# Patient Record
Sex: Male | Born: 1960 | Race: Black or African American | Hispanic: No | Marital: Single | Smoking: Never smoker
Health system: Southern US, Community
[De-identification: ages and names within clinical notes are randomized; demographics above are authoritative.]

## PROBLEM LIST (undated history)

## (undated) DIAGNOSIS — E119 Type 2 diabetes mellitus without complications: Secondary | ICD-10-CM

## (undated) DIAGNOSIS — N289 Disorder of kidney and ureter, unspecified: Secondary | ICD-10-CM

---

## 2007-12-05 ENCOUNTER — Emergency Department: Payer: Self-pay | Admitting: Emergency Medicine

## 2010-12-28 ENCOUNTER — Emergency Department: Payer: Self-pay | Admitting: Emergency Medicine

## 2010-12-30 ENCOUNTER — Emergency Department: Payer: Self-pay | Admitting: Unknown Physician Specialty

## 2011-04-15 ENCOUNTER — Emergency Department: Payer: Self-pay

## 2011-04-15 LAB — URINALYSIS, COMPLETE
Bacteria: NONE SEEN
Bilirubin,UR: NEGATIVE
Blood: NEGATIVE
Glucose,UR: NEGATIVE mg/dL (ref 0–75)
Ketone: NEGATIVE
Leukocyte Esterase: NEGATIVE
Nitrite: NEGATIVE
Ph: 5 (ref 4.5–8.0)
Protein: NEGATIVE
RBC,UR: 1 /HPF (ref 0–5)
Specific Gravity: 1.018 (ref 1.003–1.030)
Squamous Epithelial: NONE SEEN
WBC UR: 1 /HPF (ref 0–5)

## 2011-04-20 ENCOUNTER — Ambulatory Visit: Payer: Self-pay | Admitting: Internal Medicine

## 2012-04-20 ENCOUNTER — Ambulatory Visit: Payer: Self-pay

## 2012-09-21 DIAGNOSIS — M47816 Spondylosis without myelopathy or radiculopathy, lumbar region: Secondary | ICD-10-CM | POA: Insufficient documentation

## 2012-09-21 DIAGNOSIS — Y990 Civilian activity done for income or pay: Secondary | ICD-10-CM | POA: Insufficient documentation

## 2012-09-21 DIAGNOSIS — M4802 Spinal stenosis, cervical region: Secondary | ICD-10-CM | POA: Insufficient documentation

## 2012-10-25 ENCOUNTER — Emergency Department: Payer: Self-pay | Admitting: Emergency Medicine

## 2013-02-07 ENCOUNTER — Emergency Department: Payer: Self-pay | Admitting: Emergency Medicine

## 2013-02-07 LAB — COMPREHENSIVE METABOLIC PANEL
Albumin: 3.7 g/dL (ref 3.4–5.0)
Anion Gap: 2 — ABNORMAL LOW (ref 7–16)
Bilirubin,Total: 0.4 mg/dL (ref 0.2–1.0)
EGFR (Non-African Amer.): 52 — ABNORMAL LOW
Osmolality: 279 (ref 275–301)
SGPT (ALT): 32 U/L (ref 12–78)
Total Protein: 7.3 g/dL (ref 6.4–8.2)

## 2013-02-07 LAB — CBC WITH DIFFERENTIAL/PLATELET
Basophil #: 0 10*3/uL (ref 0.0–0.1)
Eosinophil #: 0.1 10*3/uL (ref 0.0–0.7)
HCT: 45.2 % (ref 40.0–52.0)
HGB: 15.5 g/dL (ref 13.0–18.0)
Lymphocyte #: 1.5 10*3/uL (ref 1.0–3.6)
MCH: 28 pg (ref 26.0–34.0)
Monocyte #: 0.5 x10 3/mm (ref 0.2–1.0)
Monocyte %: 9.8 %
Neutrophil #: 3.2 10*3/uL (ref 1.4–6.5)
Platelet: 137 10*3/uL — ABNORMAL LOW (ref 150–440)
RBC: 5.53 10*6/uL (ref 4.40–5.90)

## 2013-02-07 LAB — URINALYSIS, COMPLETE
Ketone: NEGATIVE
Leukocyte Esterase: NEGATIVE
RBC,UR: 10 /HPF (ref 0–5)
WBC UR: <1 /HPF (ref 0–5)

## 2013-02-07 LAB — LIPASE, BLOOD: Lipase: 183 U/L (ref 73–393)

## 2013-03-29 DIAGNOSIS — N182 Chronic kidney disease, stage 2 (mild): Secondary | ICD-10-CM | POA: Insufficient documentation

## 2013-03-29 DIAGNOSIS — N200 Calculus of kidney: Secondary | ICD-10-CM | POA: Insufficient documentation

## 2013-03-29 DIAGNOSIS — R339 Retention of urine, unspecified: Secondary | ICD-10-CM | POA: Insufficient documentation

## 2013-03-29 DIAGNOSIS — N138 Other obstructive and reflux uropathy: Secondary | ICD-10-CM | POA: Insufficient documentation

## 2013-03-29 DIAGNOSIS — N23 Unspecified renal colic: Secondary | ICD-10-CM | POA: Insufficient documentation

## 2015-02-04 ENCOUNTER — Ambulatory Visit
Admission: RE | Admit: 2015-02-04 | Discharge: 2015-02-04 | Disposition: A | Discharge status: Home / Self Care | Payer: Self-pay | Source: Ambulatory Visit | Attending: Family Medicine | Admitting: Family Medicine

## 2015-02-04 ENCOUNTER — Other Ambulatory Visit: Payer: Self-pay | Admitting: Family Medicine

## 2015-02-04 DIAGNOSIS — R05 Cough: Secondary | ICD-10-CM | POA: Insufficient documentation

## 2015-02-04 DIAGNOSIS — J9811 Atelectasis: Secondary | ICD-10-CM | POA: Insufficient documentation

## 2015-02-04 DIAGNOSIS — R059 Cough, unspecified: Secondary | ICD-10-CM

## 2015-02-04 DIAGNOSIS — I517 Cardiomegaly: Secondary | ICD-10-CM | POA: Insufficient documentation

## 2015-07-26 ENCOUNTER — Emergency Department
Admission: EM | Admit: 2015-07-26 | Discharge: 2015-07-26 | Disposition: A | Discharge status: Home / Self Care | Payer: Medicare Other | Attending: Emergency Medicine | Admitting: Emergency Medicine

## 2015-07-26 ENCOUNTER — Encounter: Payer: Self-pay | Admitting: Emergency Medicine

## 2015-07-26 DIAGNOSIS — E119 Type 2 diabetes mellitus without complications: Secondary | ICD-10-CM | POA: Insufficient documentation

## 2015-07-26 DIAGNOSIS — K625 Hemorrhage of anus and rectum: Secondary | ICD-10-CM | POA: Diagnosis present

## 2015-07-26 DIAGNOSIS — K649 Unspecified hemorrhoids: Secondary | ICD-10-CM | POA: Diagnosis not present

## 2015-07-26 DIAGNOSIS — K921 Melena: Secondary | ICD-10-CM | POA: Diagnosis not present

## 2015-07-26 HISTORY — DX: Type 2 diabetes mellitus without complications: E11.9

## 2015-07-26 HISTORY — DX: Disorder of kidney and ureter, unspecified: N28.9

## 2015-07-26 LAB — CBC
HCT: 41.1 % (ref 40.0–52.0)
HEMOGLOBIN: 13.5 g/dL (ref 13.0–18.0)
MCH: 27.2 pg (ref 26.0–34.0)
MCHC: 32.9 g/dL (ref 32.0–36.0)
MCV: 82.8 fL (ref 80.0–100.0)
Platelets: 119 10*3/uL — ABNORMAL LOW (ref 150–440)
RBC: 4.96 MIL/uL (ref 4.40–5.90)
RDW: 14 % (ref 11.5–14.5)
WBC: 4.7 10*3/uL (ref 3.8–10.6)

## 2015-07-26 LAB — COMPREHENSIVE METABOLIC PANEL
ALBUMIN: 4.3 g/dL (ref 3.5–5.0)
ALK PHOS: 60 U/L (ref 38–126)
ALT: 23 U/L (ref 17–63)
ANION GAP: 6 (ref 5–15)
AST: 23 U/L (ref 15–41)
BUN: 21 mg/dL — ABNORMAL HIGH (ref 6–20)
CALCIUM: 9.2 mg/dL (ref 8.9–10.3)
CHLORIDE: 108 mmol/L (ref 101–111)
CO2: 26 mmol/L (ref 22–32)
Creatinine, Ser: 1.14 mg/dL (ref 0.61–1.24)
GFR calc Af Amer: >60 mL/min (ref >60)
GFR calc non Af Amer: >60 mL/min (ref >60)
GLUCOSE: 92 mg/dL (ref 65–99)
Potassium: 3.5 mmol/L (ref 3.5–5.1)
SODIUM: 140 mmol/L (ref 135–145)
Total Bilirubin: 0.8 mg/dL (ref 0.3–1.2)
Total Protein: 7.4 g/dL (ref 6.5–8.1)

## 2015-07-26 MED ORDER — HYDROCORTISONE ACETATE 25 MG RE SUPP: 25.0000 mg | Freq: Two times a day (BID) | RECTAL | Status: AC

## 2015-07-26 NOTE — Discharge Instructions (Signed)
Stop taking aspirin until cleared to do so by GI or your primary doctor. Follow up with a GI doctor listed on the sheet or with your own gastroenterologist. Return to the emergency room for significant bleeding as discussed lightheadedness or pain any other new or worrisome symptoms.

## 2015-07-26 NOTE — ED Notes (Signed)
Pt to ed with c/o bloody stools since Wednesday.  Pt states stools are formed but have dark red blood in them.  Pt states similar episode about 5 months ago but was not seen.

## 2015-07-26 NOTE — ED Provider Notes (Signed)
[facility] Emergency Department Provider Note  ____________________________________________   I have reviewed the triage vital signs and the nursing notes.   HISTORY  Chief Complaint Rectal Bleeding    HPI Devon Price is a 55 y.o. male with a history he believes of hemorrhoids in the past its by 5 months ago he had what he thought was a hemorrhoidal issue with some hematochezia. It resolved after 3 days. Now, for last 3 days, he has had hematochezia again. He only sees blood when he wipes occasionally drop ago on the toilet. Normal brown stool otherwise. No abdominal pain and the lightheadedness not on any blood thinners aside from aspirin. He has never had any CAD, he takes aspirin only as a precaution. No history of Crohn's disease or Osler colitis and self or his family. No abdominal pain or vomiting. No diarrhea. No recent travel. He has had no rectal foreign body she states.     Past Medical History  Diagnosis Date  . Diabetes mellitus without complication (HCC)   . Renal disorder     There are no active problems to display for this patient.   History reviewed. No pertinent past surgical history.  No current outpatient prescriptions on file.  Allergies Review of patient's allergies indicates no known allergies.  No family history on file.  Social History Social History  Substance Use Topics  . Smoking status: Never Smoker   . Smokeless tobacco: None  . Alcohol Use: No    Review of Systems Constitutional: No fever/chills Eyes: No visual changes. ENT: No sore throat. No stiff neck no neck pain Cardiovascular: Denies chest pain. Respiratory: Denies shortness of breath. Gastrointestinal:   no vomiting.  No diarrhea.  No constipation. Genitourinary: Negative for dysuria. Musculoskeletal: Negative lower extremity swelling Skin: Negative for rash. Neurological: Negative for headaches, focal weakness or numbness. 10-point ROS  otherwise negative.  ____________________________________________   PHYSICAL EXAM:  VITAL SIGNS: ED Triage Vitals  Enc Vitals Group     BP 07/26/15 1120 132/80 mmHg     Pulse Rate 07/26/15 1120 69     Resp 07/26/15 1120 20     Temp 07/26/15 1120 98.2 F (36.8 C)     Temp Source 07/26/15 1120 Oral     SpO2 07/26/15 1120 100 %     Weight 07/26/15 1120 210 lb (95.255 kg)     Height 07/26/15 1120 5\' 6"  (1.676 m)     Head Cir --      Peak Flow --      Pain Score 07/26/15 1121 0     Pain Loc --      Pain Edu? --      Excl. in GC? --     Constitutional: Alert and oriented. Well appearing and in no acute distress. Eyes: Conjunctivae are normal. Mouth/Throat: Mucous membranes are moist.  Oropharynx non-erythematous. Cardiovascular: Normal rate, regular rhythm. Grossly normal heart sounds.  Good peripheral circulation. Respiratory: Normal respiratory effort.  No retractions. Lungs CTAB. Abdominal: Soft and nontender. No distention. No guarding no rebound Rectal exam: Evidence of old external hemorrhoids noted, no acute thrombosed hemorrhoid, no tenderness no induration or evidence of abscess, no thrombosed hemorrhoid noted, no active bleeding guaiac-negative Back:  There is no focal tenderness or step off there is no midline tenderness there are no lesions noted. there is no CVA tenderness Neurologic:  Normal speech and language. No gross focal neurologic deficits are appreciated.  Skin:  Skin is warm, dry and intact.  No rash noted. Psychiatric: Mood and affect are normal. Speech and behavior are normal.  ____________________________________________   LABS (all labs ordered are listed, but only abnormal results are displayed)  Labs Reviewed  COMPREHENSIVE METABOLIC PANEL - Abnormal; Notable for the following:    BUN 21 (*)    All other components within normal limits  CBC - Abnormal; Notable for the following:    Platelets 119 (*)    All other components within normal limits   POC OCCULT BLOOD, ED  TYPE AND SCREEN   ____________________________________________  EKG   ____________________________________________  RADIOLOGY  I reviewed any imaging ordered by me or triage that were performed during my shift and, if possible, patient and/or family made aware of any abnormal findings. ____________________________________________   PROCEDURES  Procedure(s) performed: None  Critical Care performed: None  ____________________________________________   INITIAL IMPRESSION / ASSESSMENT AND PLAN / ED COURSE  Pertinent labs & imaging results that were available during my care of the patient were reviewed by me and considered in my medical decision making (see chart for details).  Patient with evidence of prior hemorrhoids with hematochezia with normal CBC and reassuring blood work. Extensive return precautions given and understood. Patient advised not to take aspirin until cleared by GI. We'll refer him to GI. He states he has had a negative colonoscopy in the last 5 years. He cannot recall the name of the GI physician he saw however. We will refer him to the on-call but obviously he can follow up with his primary GI doctor as he sees fit. Patient understands he must come back for bleeding lightheadedness abdominal pain or any other new or worrisome symptoms. He is very comfortable with this plan.. ____________________________________________   FINAL CLINICAL IMPRESSION(S) / ED DIAGNOSES  Final diagnoses:  None      This chart was dictated using voice recognition software.  Despite best efforts to proofread,  errors can occur which can change meaning.      Jeanmarie Plant, MD 07/26/15 [phone]

## 2015-09-29 DIAGNOSIS — I300 Acute nonspecific idiopathic pericarditis: Secondary | ICD-10-CM | POA: Insufficient documentation

## 2015-11-11 ENCOUNTER — Other Ambulatory Visit: Payer: Self-pay | Admitting: Orthopedic Surgery

## 2015-11-11 DIAGNOSIS — M545 Low back pain: Secondary | ICD-10-CM

## 2015-11-21 ENCOUNTER — Ambulatory Visit
Admission: RE | Admit: 2015-11-21 | Discharge: 2015-11-21 | Disposition: A | Discharge status: Home / Self Care | Payer: Medicare Other | Source: Ambulatory Visit | Attending: Orthopedic Surgery | Admitting: Orthopedic Surgery

## 2015-11-21 DIAGNOSIS — M5442 Lumbago with sciatica, left side: Secondary | ICD-10-CM | POA: Insufficient documentation

## 2015-11-21 DIAGNOSIS — Q799 Congenital malformation of musculoskeletal system, unspecified: Secondary | ICD-10-CM | POA: Insufficient documentation

## 2015-11-21 DIAGNOSIS — M47816 Spondylosis without myelopathy or radiculopathy, lumbar region: Secondary | ICD-10-CM | POA: Insufficient documentation

## 2015-11-21 DIAGNOSIS — M4806 Spinal stenosis, lumbar region: Secondary | ICD-10-CM | POA: Insufficient documentation

## 2015-11-21 DIAGNOSIS — M545 Low back pain: Secondary | ICD-10-CM

## 2016-01-31 ENCOUNTER — Other Ambulatory Visit: Payer: Self-pay | Admitting: Neurological Surgery

## 2016-01-31 DIAGNOSIS — M5412 Radiculopathy, cervical region: Secondary | ICD-10-CM

## 2016-02-12 ENCOUNTER — Ambulatory Visit
Admission: RE | Admit: 2016-02-12 | Discharge: 2016-02-12 | Disposition: A | Discharge status: Home / Self Care | Payer: Medicare Other | Source: Ambulatory Visit | Attending: Neurological Surgery | Admitting: Neurological Surgery

## 2016-02-12 DIAGNOSIS — M50223 Other cervical disc displacement at C6-C7 level: Secondary | ICD-10-CM | POA: Insufficient documentation

## 2016-02-12 DIAGNOSIS — M50221 Other cervical disc displacement at C4-C5 level: Secondary | ICD-10-CM | POA: Insufficient documentation

## 2016-02-12 DIAGNOSIS — M5412 Radiculopathy, cervical region: Secondary | ICD-10-CM | POA: Diagnosis present

## 2016-02-12 DIAGNOSIS — M5022 Other cervical disc displacement, mid-cervical region, unspecified level: Secondary | ICD-10-CM | POA: Insufficient documentation

## 2016-02-12 DIAGNOSIS — M50222 Other cervical disc displacement at C5-C6 level: Secondary | ICD-10-CM | POA: Insufficient documentation

## 2016-07-06 DIAGNOSIS — N183 Chronic kidney disease, stage 3 unspecified: Secondary | ICD-10-CM | POA: Insufficient documentation

## 2016-07-06 DIAGNOSIS — Z0000 Encounter for general adult medical examination without abnormal findings: Secondary | ICD-10-CM | POA: Insufficient documentation

## 2016-07-06 DIAGNOSIS — J309 Allergic rhinitis, unspecified: Secondary | ICD-10-CM | POA: Insufficient documentation

## 2016-07-06 DIAGNOSIS — E1122 Type 2 diabetes mellitus with diabetic chronic kidney disease: Secondary | ICD-10-CM | POA: Insufficient documentation

## 2016-07-06 DIAGNOSIS — E7800 Pure hypercholesterolemia, unspecified: Secondary | ICD-10-CM | POA: Insufficient documentation

## 2016-11-25 ENCOUNTER — Encounter: Payer: Self-pay | Admitting: *Deleted

## 2016-11-25 ENCOUNTER — Ambulatory Visit
Admission: EM | Admit: 2016-11-25 | Discharge: 2016-11-25 | Disposition: A | Discharge status: Home / Self Care | Payer: Medicare Other | Attending: Family Medicine | Admitting: Family Medicine

## 2016-11-25 DIAGNOSIS — L259 Unspecified contact dermatitis, unspecified cause: Secondary | ICD-10-CM

## 2016-11-25 MED ORDER — TRIAMCINOLONE ACETONIDE 0.1 % EX CREA: 1.0000 "application " | TOPICAL_CREAM | Freq: Two times a day (BID) | CUTANEOUS | 0 refills | Status: AC

## 2016-11-25 NOTE — ED Provider Notes (Signed)
[facility]    CSN: [id number] Arrival date & time: 11/25/16  0847     History   Chief Complaint Chief Complaint  Patient presents with  . Rash  . Neck Injury    HPI Devon Price is a 56 y.o. male.   56 yo male with a c/o red, itchy rash to neck area for 3 days. Denies any fevers, chills, drainage and does not recall any specific contact with any specific allergens. Denies wheezing, shortness of breath or swelling.    The history is provided by the patient.    Past Medical History:  Diagnosis Date  . Diabetes mellitus without complication (HCC)   . Renal disorder     There are no active problems to display for this patient.   History reviewed. No pertinent surgical history.     Home Medications    Prior to Admission medications   Medication Sig Start Date End Date Taking? Authorizing Provider  metFORMIN (GLUCOPHAGE) 1000 MG tablet Take 1,000 mg by mouth 2 (two) times daily with a meal.   Yes [provider]  triamcinolone cream (KENALOG) 0.1 % Apply 1 application topically 2 (two) times daily. 11/25/16   Payton Mccallum, MD    Family History History reviewed. No pertinent family history.  Social History Social History  Substance Use Topics  . Smoking status: Never Smoker  . Smokeless tobacco: Never Used  . Alcohol use No     Allergies   Patient has no known allergies.   Review of Systems Review of Systems   Physical Exam Triage Vital Signs ED Triage Vitals  Enc Vitals Group     BP 11/25/16 0905 116/71     Pulse Rate 11/25/16 0905 77     Resp 11/25/16 0905 12     Temp 11/25/16 0905 98.1 F (36.7 C)     Temp Source 11/25/16 0905 Oral     SpO2 11/25/16 0905 99 %     Weight 11/25/16 0902 230 lb (104.3 kg)     Height 11/25/16 0902  (1.651 m)     Head Circumference --      Peak Flow --      Pain Score --      Pain Loc --      Pain Edu? --      Excl. in GC? --    No data found.   Updated Vital  Signs BP 116/71 (BP Location: Left Arm)   Pulse 77   Temp 98.1 F (36.7 C) (Oral)   Resp 12   Ht  (1.651 m)   Wt 230 lb (104.3 kg)   SpO2 99%   BMI 38.27 kg/m   Visual Acuity Right Eye Distance:   Left Eye Distance:   Bilateral Distance:    Right Eye Near:   Left Eye Near:    Bilateral Near:     Physical Exam  Constitutional: He appears well-developed and well-nourished. No distress.  Skin: Rash (6x7cm area of skin erythema on right anterior neck area) noted. He is not diaphoretic. There is erythema.  Nursing note and vitals reviewed.    UC Treatments / Results  Labs (all labs ordered are listed, but only abnormal results are displayed) Labs Reviewed - No data to display  EKG  EKG Interpretation None    [id number] No results found.  Procedures Procedures (including critical care time)  Medications Ordered in UC Medications - No data to display   Initial  Impression / Assessment and Plan / UC Course  I have reviewed the triage vital signs and the nursing notes.  Pertinent labs & imaging results that were available during my care of the patient were reviewed by me and considered in my medical decision making (see chart for details).       Final Clinical Impressions(s) / UC Diagnoses   Final diagnoses:  Contact dermatitis, unspecified contact dermatitis type, unspecified trigger    New Prescriptions New Prescriptions   TRIAMCINOLONE CREAM (KENALOG) 0.1 %    Apply 1 application topically 2 (two) times daily.   1. diagnosis reviewed with patient 2. rx as per orders above; reviewed possible side effects, interactions, risks and benefits  3. Recommend supportive treatment with otc antihistamines prn 4. Follow-up prn if symptoms worsen or don't improve  Controlled Substance Prescriptions [place] Controlled Substance Registry consulted? Not Applicable   Payton Mccallum, MD 11/25/16 0930

## 2016-11-25 NOTE — ED Triage Notes (Signed)
Pt noticed a rash to his neck on Saturday. Pt reports not taking new medicines or new foods

## 2017-01-10 ENCOUNTER — Other Ambulatory Visit: Payer: Self-pay | Admitting: Obstetrics and Gynecology

## 2017-03-11 DIAGNOSIS — G43709 Chronic migraine without aura, not intractable, without status migrainosus: Secondary | ICD-10-CM | POA: Insufficient documentation

## 2017-05-26 ENCOUNTER — Other Ambulatory Visit: Payer: Self-pay | Admitting: Obstetrics and Gynecology

## 2017-05-26 DIAGNOSIS — R590 Localized enlarged lymph nodes: Secondary | ICD-10-CM

## 2017-06-01 ENCOUNTER — Ambulatory Visit
Admission: RE | Admit: 2017-06-01 | Discharge: 2017-06-01 | Disposition: A | Discharge status: Home / Self Care | Payer: Medicare Other | Source: Ambulatory Visit | Attending: Obstetrics and Gynecology | Admitting: Obstetrics and Gynecology

## 2017-06-01 DIAGNOSIS — R590 Localized enlarged lymph nodes: Secondary | ICD-10-CM | POA: Insufficient documentation

## 2017-09-05 ENCOUNTER — Other Ambulatory Visit: Payer: Self-pay | Admitting: Obstetrics and Gynecology

## 2017-09-24 ENCOUNTER — Other Ambulatory Visit: Payer: Self-pay | Admitting: Obstetrics and Gynecology

## 2018-02-25 ENCOUNTER — Other Ambulatory Visit: Payer: Self-pay | Admitting: Obstetrics and Gynecology

## 2018-03-11 ENCOUNTER — Other Ambulatory Visit: Payer: Self-pay | Admitting: Obstetrics and Gynecology

## 2018-05-09 ENCOUNTER — Other Ambulatory Visit: Payer: Self-pay | Admitting: Obstetrics and Gynecology

## 2018-09-20 IMAGING — MR MR CERVICAL SPINE W/O CM
5 series · 35 of 48 positions shown · non-contrast
Comparison: None.

CLINICAL DATA: 55-year-old male injury 5 years ago. Neck pain
extends down left arm with numbness and tingling to elbow. Initial
encounter.

EXAM:
MRI CERVICAL SPINE WITHOUT CONTRAST
TECHNIQUE: Multiplanar, multisequence MR imaging of the cervical spine was
performed. No intravenous contrast was administered.

[Series 3: T2 · sagittal · 3.0mm · 0.70mm/px · 8 of 17 slices shown (1 of 2)]
[im 1/17]
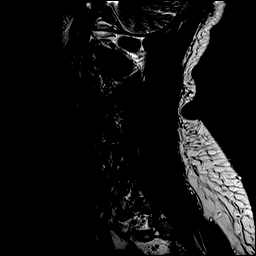
[im 3/17]
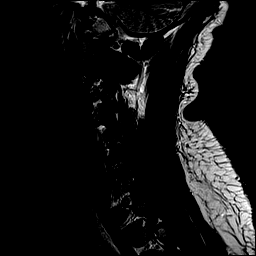
[im 5/17]
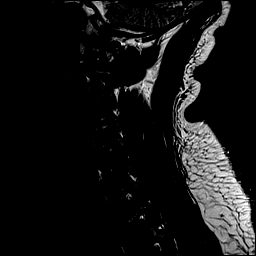
[im 7/17]
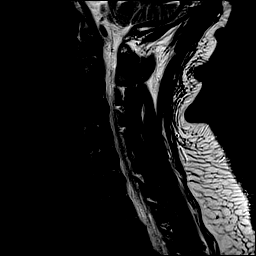
[im 10/17]
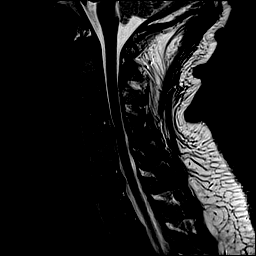
[im 12/17]
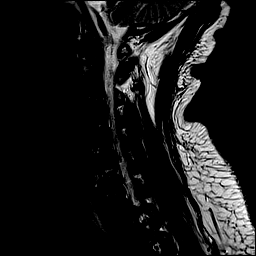
[im 14/17]
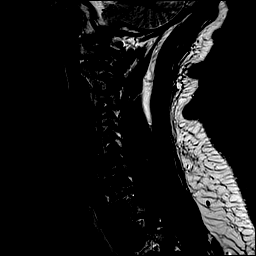
[im 17/17]
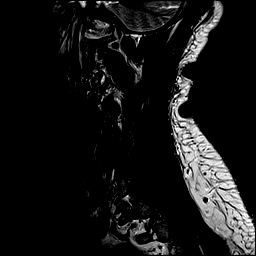

[Series 4: T1 · sagittal · 3.0mm · 0.70mm/px · 7 of 17 slices shown]
[im 1/17]
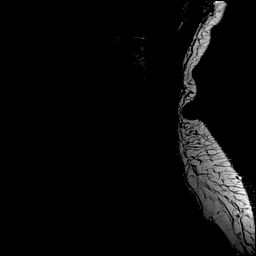
[im 3/17]
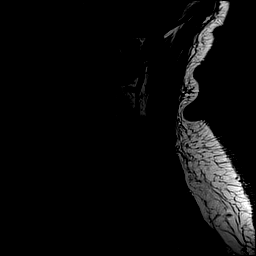
[im 6/17]
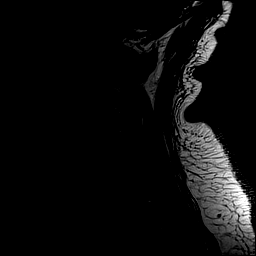
[im 9/17]
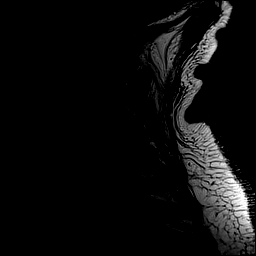
[im 11/17]
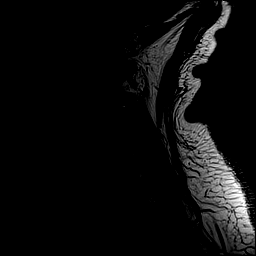
[im 14/17]
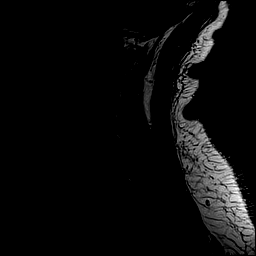
[im 17/17]
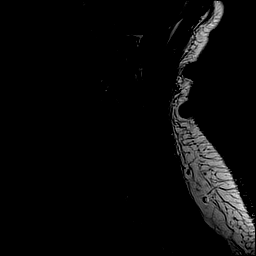

[Series 5: STIR · sagittal · 3.0mm · 0.35mm/px · 7 of 17 slices shown]
[im 1/17]
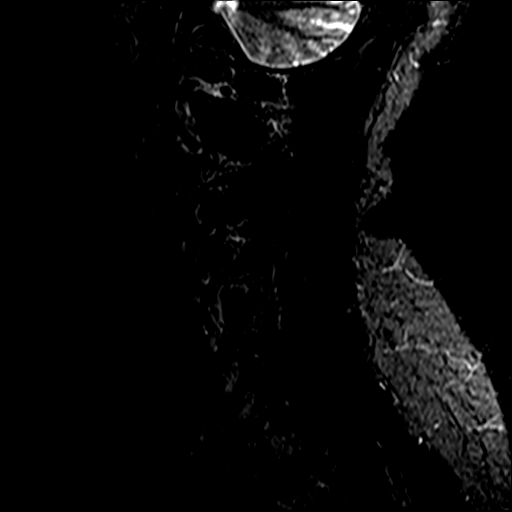
[im 3/17]
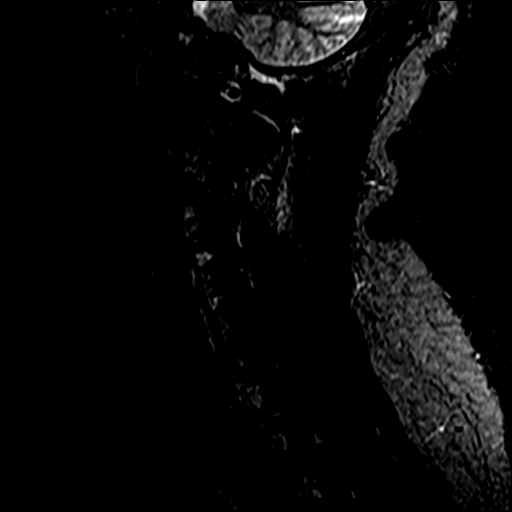
[im 6/17]
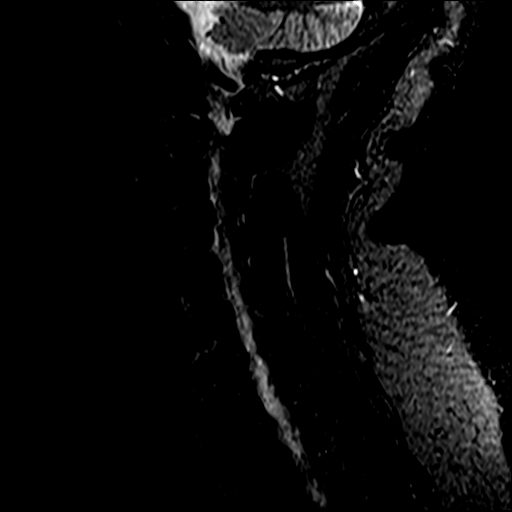
[im 9/17]
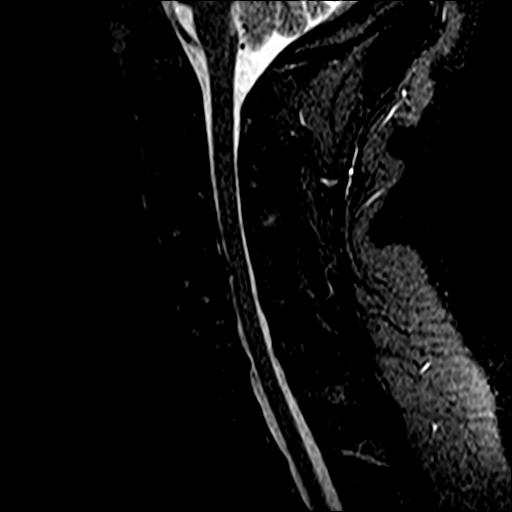
[im 11/17]
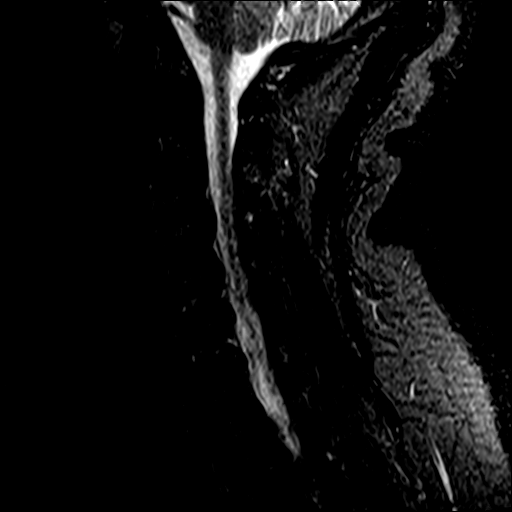
[im 14/17]
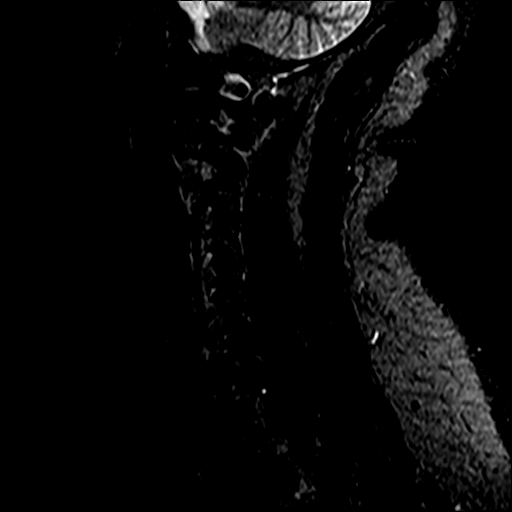
[im 17/17]
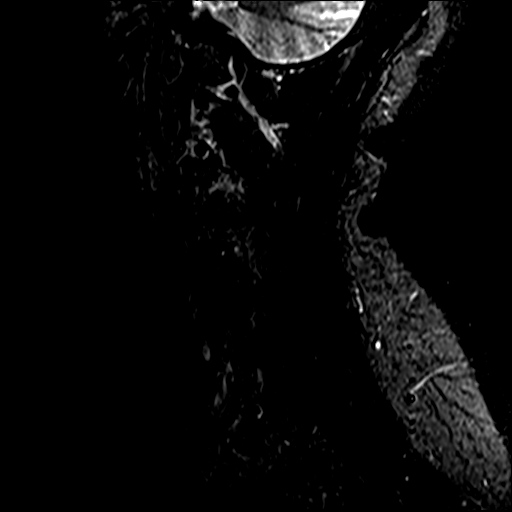

[Series 6: T2 · axial · 3.0mm · 0.70mm/px · z∈[-36,+70]mm · 9 of 29 slices shown (2 of 2)]
[im 1/29]
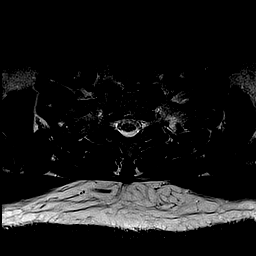
[im 5/29]
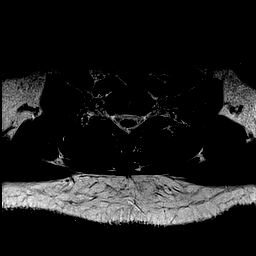
[im 10/29]
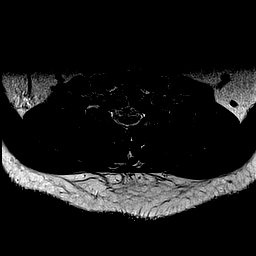
[im 12/29]
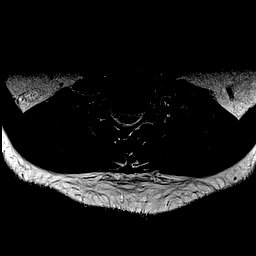
[im 15/29]
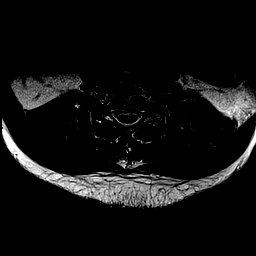
[im 17/29]
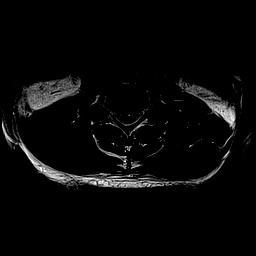
[im 19/29]
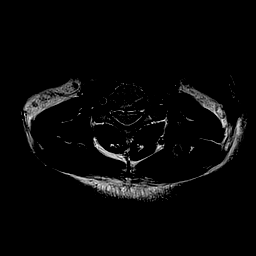
[im 24/29]
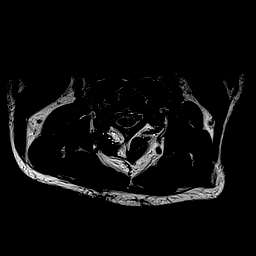
[im 29/29]
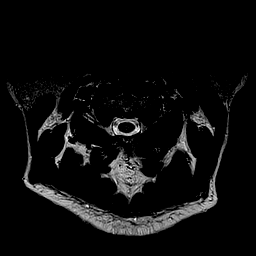

[Series 7: mpgr ax · axial · 3.0mm · 0.35mm/px · z∈[-36,+5]mm · 4 of 29 slices shown]
[im 1/29]
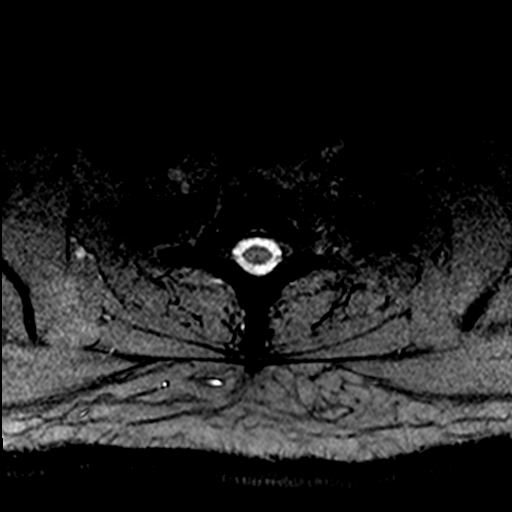
[im 5/29]
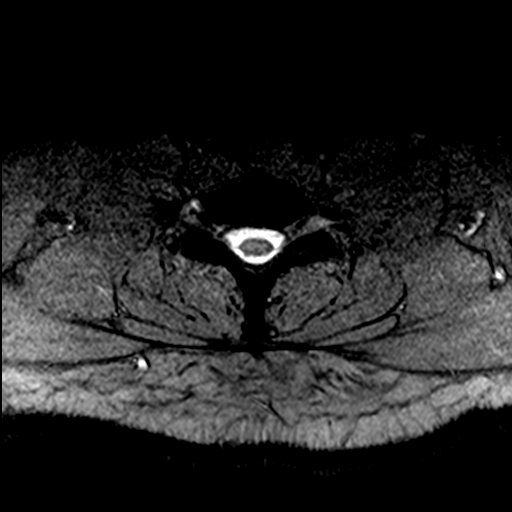
[im 10/29]
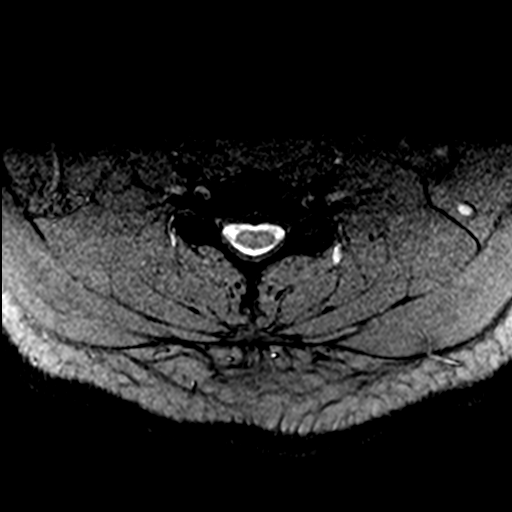
[im 12/29]
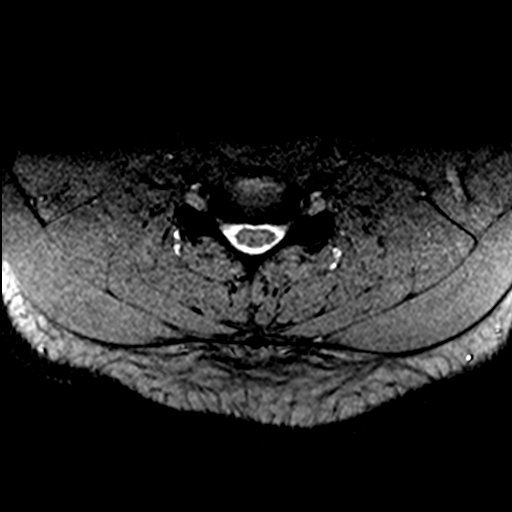

[35 of 48 positions shown; findings below may reference images not displayed]

FINDINGS: Alignment: Slight straightening cervical spine.

Vertebrae: Decreased signal intensity of bone marrow may be related
to patient's habitus. Correlation with CBC to exclude anemia
contributing to this appearance may be considered

Cord: No focal cord signal abnormality.

Posterior Fossa, vertebral arteries, paraspinal tissues: Slight
prominence adenoidal tissue.

Disc levels:

C2-3:  Negative.

C3-4: Minimal bulge. Minimal narrowing ventral thecal sac. Mild
uncinate hypertrophy and mild foraminal narrowing slightly greater
on the left.

C4-5: Bulge with small to slightly moderate-size focal right
paracentral protrusion with flattening of the right aspect of the
cord. Minimal caudal and mild cephalad extension of this protrusion,
elevating the posterior longitudinal ligament. Minimal foraminal
narrowing greater on the right.

C5-6: Minimal to mild bulge. Mild narrowing ventral thecal sac.
Uncinate hypertrophy with mild to moderate right-sided and mild
left-sided foraminal narrowing.

C6-7: Bulge with narrowing ventral thecal sac. Uncinate hypertrophy
with moderate bilateral foraminal narrowing greater on the left.

C7-T1:  No significant spinal stenosis or foraminal narrowing.

T1-2:  Minimal bulge.
IMPRESSION: Most notable finding on the present examination is the right-sided
C4-5 disc protrusion. Various degrees of foraminal narrowing
bilaterally with summary of pertinent findings including:

C3-4 minimal bulge. Minimal narrowing ventral thecal sac. Mild
bilateral foraminal narrowing slightly greater on the left.

C4-5 small to slightly moderate-size focal right paracentral
protrusion with flattening of the right aspect of the cord. Minimal
foraminal narrowing bilaterally greater on the right.

C5-6 minimal to mild bulge. Mild narrowing ventral thecal sac.
Uncinate hypertrophy with mild to moderate right-sided and mild
left-sided foraminal narrowing.

C6-7 bulge with narrowing ventral thecal sac. Uncinate hypertrophy
with moderate bilateral foraminal narrowing greater on the left.

Decreased signal intensity of bone marrow may be related to
patient's habitus. Correlation with CBC to exclude anemia
contributing to this appearance may be considered

## 2019-03-28 ENCOUNTER — Other Ambulatory Visit
Admission: RE | Admit: 2019-03-28 | Discharge: 2019-03-28 | Disposition: A | Discharge status: Home / Self Care | Payer: Medicare Other | Source: Ambulatory Visit | Attending: Internal Medicine | Admitting: Internal Medicine

## 2019-03-28 DIAGNOSIS — R600 Localized edema: Secondary | ICD-10-CM | POA: Insufficient documentation

## 2019-03-28 LAB — FIBRIN DERIVATIVES D-DIMER (ARMC ONLY): Fibrin derivatives D-dimer (ARMC): 351.43 ng/mL (FEU) (ref 0.00–499.00)

## 2019-04-10 DIAGNOSIS — R002 Palpitations: Secondary | ICD-10-CM | POA: Insufficient documentation

## 2019-05-25 ENCOUNTER — Ambulatory Visit: Payer: Medicare Other | Attending: Internal Medicine

## 2019-05-25 DIAGNOSIS — Z23 Encounter for immunization: Secondary | ICD-10-CM

## 2019-05-25 NOTE — Progress Notes (Signed)
   Covid-19 Vaccination Clinic  Name:  Devon Price    MRN: [id number] DOB: 10/03/1960  05/25/2019  [name] was observed post Covid-19 immunization for 15 minutes without incident. He was provided with Vaccine Information Sheet and instruction to access the V-Safe system.   [name] was instructed to call 911 with any severe reactions post vaccine: Marland Kitchen Difficulty breathing  . Swelling of face and throat  . A fast heartbeat  . A bad rash all over body  . Dizziness and weakness   Immunizations Administered    Name Date Dose VIS Date Route   Pfizer COVID-19 Vaccine 05/25/2019 10:20 AM 0.3 mL 02/03/2019 Intramuscular   Manufacturer: [employer], [employer]   Lot: [id number]   NDC: [address]-0

## 2019-06-20 ENCOUNTER — Ambulatory Visit: Payer: Medicare Other | Attending: Internal Medicine

## 2019-06-20 DIAGNOSIS — Z23 Encounter for immunization: Secondary | ICD-10-CM

## 2019-06-20 NOTE — Progress Notes (Signed)
   Covid-19 Vaccination Clinic  Name:  Devon Price    MRN: [id number] DOB: 11/19/1960  06/20/2019  [name] was observed post Covid-19 immunization for 15 minutes without incident. He was provided with Vaccine Information Sheet and instruction to access the V-Safe system.   [name] was instructed to call 911 with any severe reactions post vaccine: Marland Kitchen Difficulty breathing  . Swelling of face and throat  . A fast heartbeat  . A bad rash all over body  . Dizziness and weakness   Immunizations Administered    Name Date Dose VIS Date Route   Pfizer COVID-19 Vaccine 06/20/2019 10:05 AM 0.3 mL 04/19/2018 Intramuscular   Manufacturer: [employer], [employer]   Lot: [id number]   NDC: [address]-2

## 2020-01-08 IMAGING — US US SOFT TISSUE HEAD/NECK
1 series · 11 of 11 positions shown · non-contrast
Comparison: None.

CLINICAL DATA: Palpable abnormality within the left supraclavicular
fossa of posterior aspect the left neck.

EXAM:
ULTRASOUND OF HEAD/NECK SOFT TISSUES
TECHNIQUE: Ultrasound examination of the head and neck soft tissues was
performed in the area of clinical concern.

[Series 1: us soft tissue head/neck · 11 of 11 slices shown]
[im 1/11]
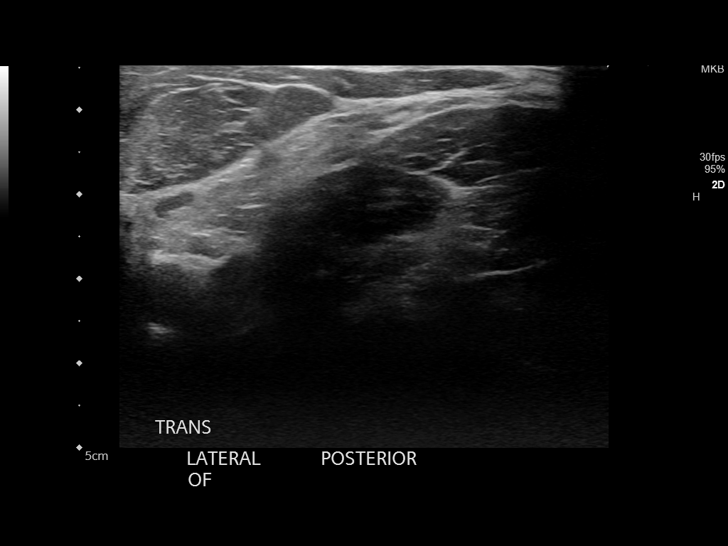
[im 2/11]
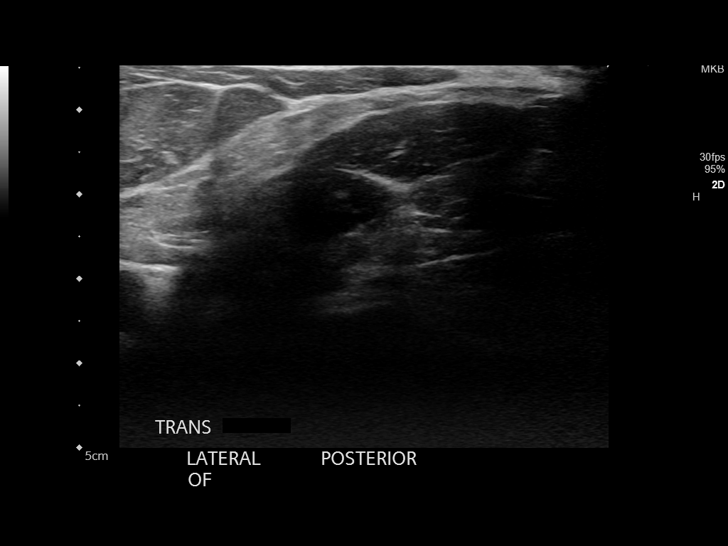
[im 3/11]
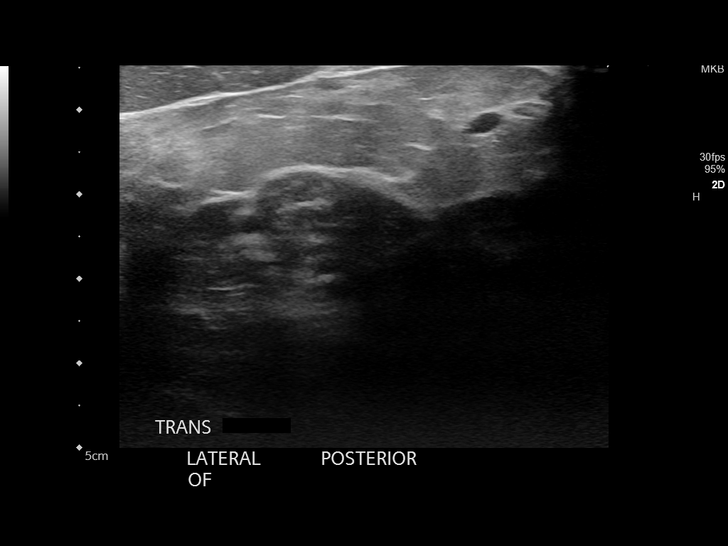
[im 4/11]
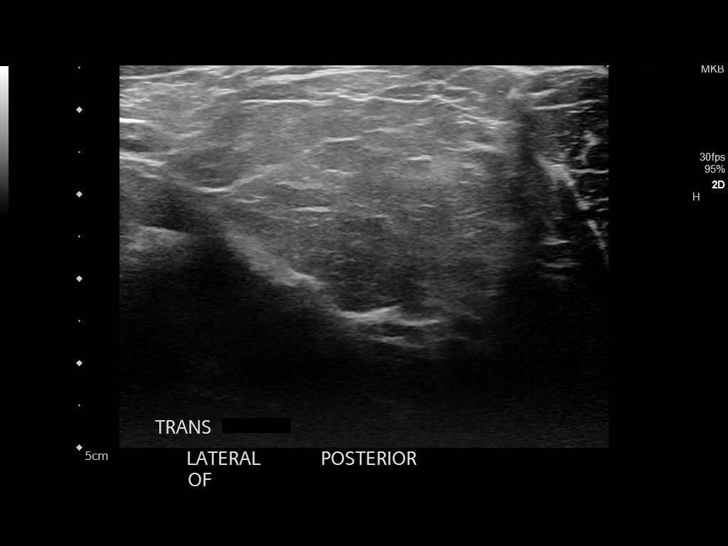
[im 5/11]
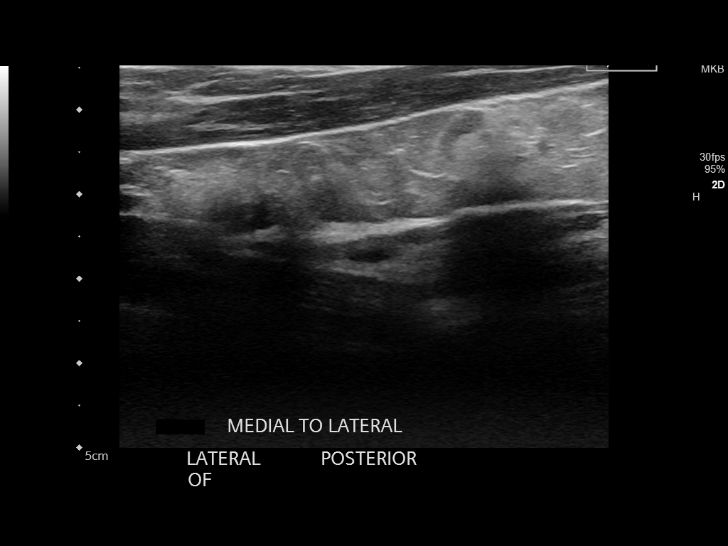
[im 6/11]
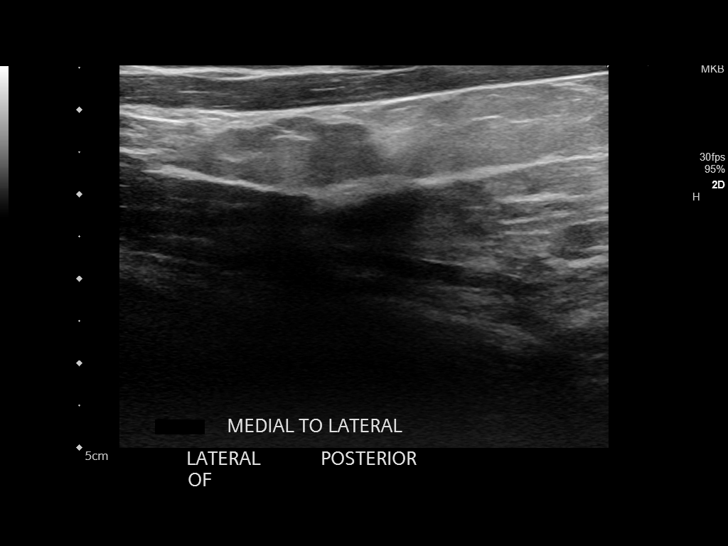
[im 7/11]
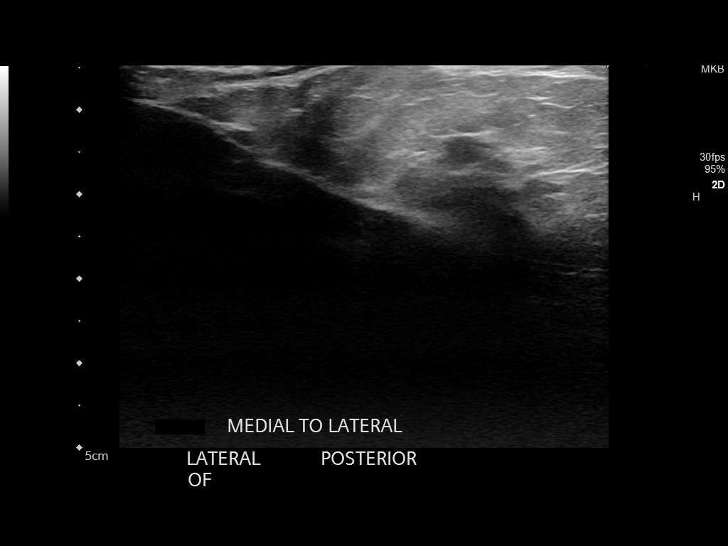
[im 8/11]
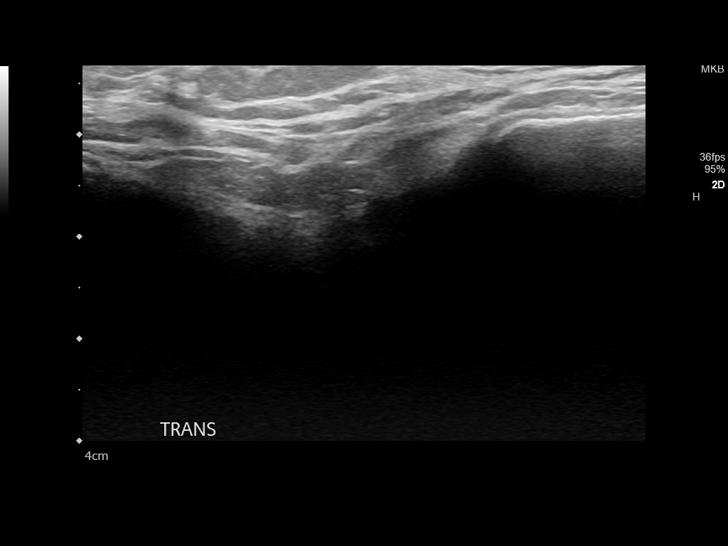
[im 9/11]
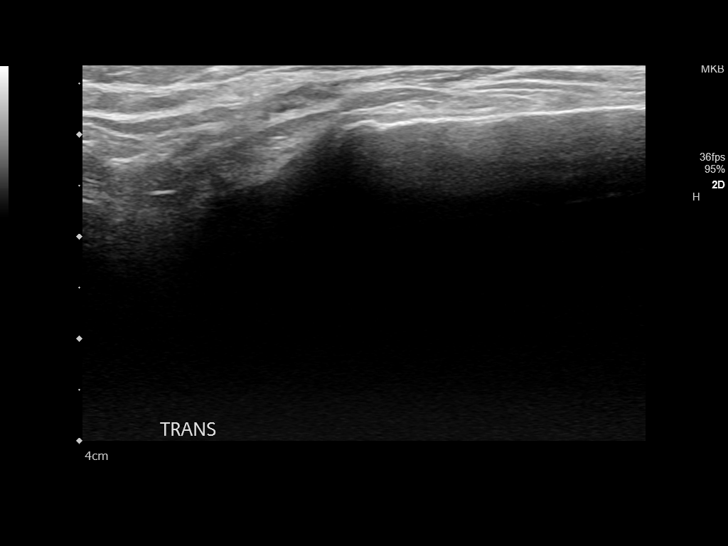
[im 10/11]
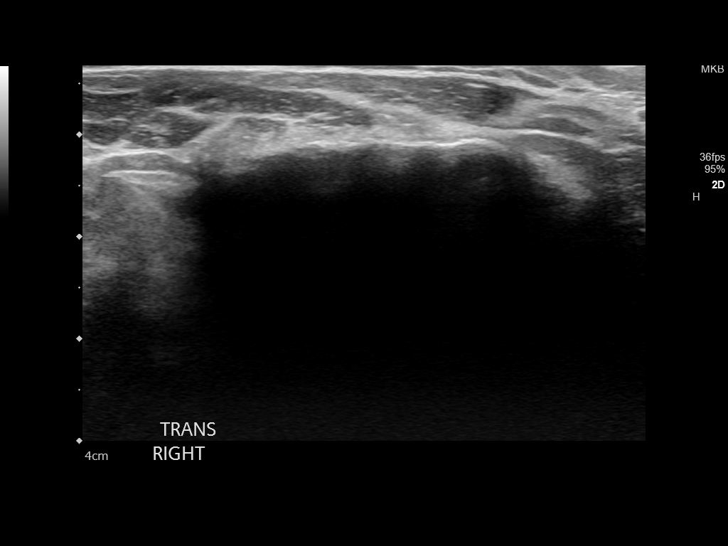
[im 11/11]
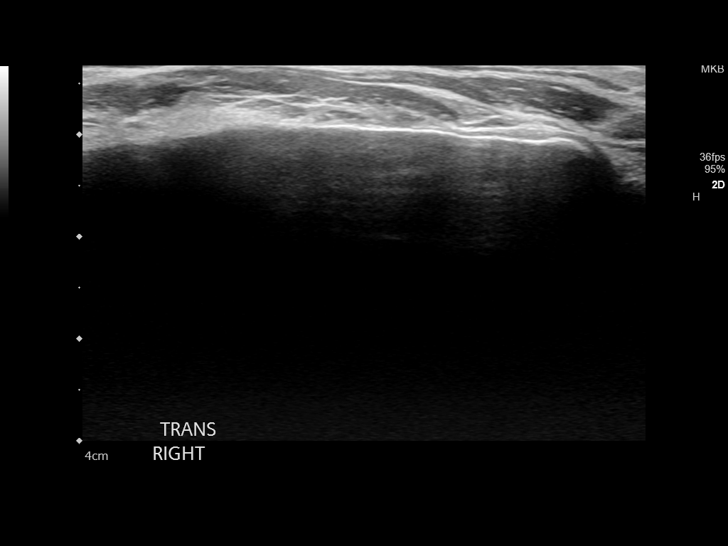

[11 of 11 positions shown; findings below may reference images not displayed]

FINDINGS: Provided grayscale images of the patient's palpable area of concern
are negative for discrete solid or cystic lesion. No cervical
lymphadenopathy.
IMPRESSION: No sonographic correlate for patient's palpable area of concern
involving the left-side of the neck.

## 2020-04-16 DIAGNOSIS — I10 Essential (primary) hypertension: Secondary | ICD-10-CM | POA: Insufficient documentation

## 2021-03-10 ENCOUNTER — Other Ambulatory Visit: Payer: Self-pay

## 2021-03-10 ENCOUNTER — Encounter: Payer: Self-pay | Admitting: Podiatry

## 2021-03-10 ENCOUNTER — Ambulatory Visit (INDEPENDENT_AMBULATORY_CARE_PROVIDER_SITE_OTHER): Payer: Medicare Other | Admitting: Podiatry

## 2021-03-10 DIAGNOSIS — M79675 Pain in left toe(s): Secondary | ICD-10-CM

## 2021-03-10 DIAGNOSIS — E1122 Type 2 diabetes mellitus with diabetic chronic kidney disease: Secondary | ICD-10-CM | POA: Diagnosis not present

## 2021-03-10 DIAGNOSIS — N182 Chronic kidney disease, stage 2 (mild): Secondary | ICD-10-CM

## 2021-03-10 DIAGNOSIS — M79674 Pain in right toe(s): Secondary | ICD-10-CM

## 2021-03-10 DIAGNOSIS — M722 Plantar fascial fibromatosis: Secondary | ICD-10-CM | POA: Diagnosis not present

## 2021-03-10 DIAGNOSIS — B351 Tinea unguium: Secondary | ICD-10-CM

## 2021-03-10 DIAGNOSIS — N183 Chronic kidney disease, stage 3 unspecified: Secondary | ICD-10-CM

## 2021-03-10 NOTE — Progress Notes (Signed)
This patient presents  to my office for at risk foot care.  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  This patient is unable to cut nails himself since the patient cannot reach his nails.These nails are painful walking and wearing shoes.  He also says he is experiencing pain on the bottom of both feet after a days activity.  He says the feet become swollen and painful  He says he has tried insoles  and change of shoes but problem persists. This patient presents for at risk foot care today.  General Appearance  Alert, conversant and in no acute stress.  Vascular  Dorsalis pedis and posterior tibial  pulses are palpable  bilaterally.  Capillary return is within normal limits  bilaterally. Temperature is within normal limits  bilaterally.  Neurologic  Senn-Weinstein monofilament wire test within normal limits  bilaterally. Muscle power within normal limits bilaterally.  Nails Thick disfigured discolored nails with subungual debris  hallux nails bilaterally. No evidence of bacterial infection or drainage bilaterally.  Orthopedic  No limitations of motion  feet .  No crepitus or effusions noted.  No bony pathology or digital deformities noted. Palpable pain along the course of plantar fascia  B/L.  Excessive pronation noted.  Skin  normotropic skin with no porokeratosis noted bilaterally.  No signs of infections or ulcers noted.     Onychomycosis  Pain in right toes  Pain in left toes  Plantar fasciitis  B/L  Consent was obtained for treatment procedures.   Mechanical debridement of nails 1-5  bilaterally performed with a nail nipper.  Filed with dremel without incident. Discussed plantar fasciitis with this patient.  Dispense powerstep insoles to be worn.  If pain persists he was told to return to the office for further evaluation and treatment.   Return office visit    prn                 Told patient to return  for periodic foot care and evaluation due to potential at risk complications.   Helane Gunther DPM

## 2021-04-29 ENCOUNTER — Ambulatory Visit (INDEPENDENT_AMBULATORY_CARE_PROVIDER_SITE_OTHER): Payer: Medicare Other | Admitting: Podiatry

## 2021-04-29 ENCOUNTER — Other Ambulatory Visit: Payer: Self-pay

## 2021-04-29 ENCOUNTER — Encounter: Payer: Self-pay | Admitting: Podiatry

## 2021-04-29 DIAGNOSIS — M722 Plantar fascial fibromatosis: Secondary | ICD-10-CM

## 2021-04-29 DIAGNOSIS — M24573 Contracture, unspecified ankle: Secondary | ICD-10-CM

## 2021-04-29 NOTE — Progress Notes (Signed)
?Subjective:  ?Patient ID: Devon Price, male    DOB: 1960/04/19,  MRN: [id number] ? ?Chief Complaint  ?Patient presents with  ? Foot Pain  ?  "Right now they're okay but later on they won't be."  ? ? ?61 y.o. male presents with the above complaint.  Patient presents with bilateral heel pain.  Patient says is progressive gotten worse.  Hurts with ambulation hurts when getting up from couch.  Patient has been post attic dyskinesia sciatica symptoms.  He has not seen anyone as prior to seeing me.  He sees Dr. Collene Schlichter for routine foot care.  He would like to discuss his heel pain.  Pain scale 7 out of 10.  Dull achy in nature. ? ? ?Review of Systems: Negative except as noted in the HPI. Denies N/V/F/Ch. ? ?Past Medical History:  ?Diagnosis Date  ? Diabetes mellitus without complication (HCC)   ? Renal disorder   ? ? ?Current Outpatient Medications:  ?  azelastine (ASTELIN) 0.1 % nasal spray, USE 2 SPRAYS INTO EACH NOSTRIL TWICE A DAY, Disp: , Rfl:  ?  fluticasone-salmeterol (WIXELA INHUB) 100-50 MCG/ACT AEPB, INHALE 1 INHALATION INTO THE LUNGS EVERY 12 HOURS, Disp: , Rfl:  ?  furosemide (LASIX) 20 MG tablet, Take 20 mg by mouth daily as needed., Disp: , Rfl:  ?  metFORMIN (GLUCOPHAGE) 1000 MG tablet, Take 1,000 mg by mouth 2 (two) times daily with a meal., Disp: , Rfl:  ?  omeprazole (PRILOSEC) 20 MG capsule, SMARTSIG:1 Capsule(s) By Mouth Every Evening, Disp: , Rfl:  ?  oxyCODONE-acetaminophen (PERCOCET) 7.5-325 MG tablet, Take 1 tablet by mouth 4 (four) times daily as needed., Disp: , Rfl:  ?  tiZANidine (ZANAFLEX) 4 MG tablet, Take by mouth., Disp: , Rfl:  ?  triamcinolone cream (KENALOG) 0.1 %, Apply 1 application topically 2 (two) times daily., Disp: 30 g, Rfl: 0 ? ?Social History  ? ?Tobacco Use  ?Smoking Status Never  ?Smokeless Tobacco Never  ? ? ?No Known Allergies ?Objective:  ?There were no vitals filed for this visit. ?There is no height or weight on file to calculate BMI. ?Constitutional Well  developed. ?Well nourished.  ?Vascular Dorsalis pedis pulses palpable bilaterally. ?Posterior tibial pulses palpable bilaterally. ?Capillary refill normal to all digits.  ?No cyanosis or clubbing noted. ?Pedal hair growth normal.  ?Neurologic Normal speech. ?Oriented to person, place, and time. ?Epicritic sensation to light touch grossly present bilaterally.  ?Dermatologic Nails well groomed and normal in appearance. ?No open wounds. ?No skin lesions.  ?Orthopedic: Normal joint ROM without pain or crepitus bilaterally. ?No visible deformities. ?Tender to palpation at the calcaneal tuber bilaterally. ?No pain with calcaneal squeeze bilaterally. ?Ankle ROM diminished range of motion bilaterally. ?Silfverskiold Test: positive bilaterally.  ? ?Radiographs: None ?Assessment:  ? ?1. Plantar fasciitis, right   ?2. Plantar fasciitis, left   ?3. Equinus contracture of ankle   ? ?Plan:  ?Patient was evaluated and treated and all questions answered. ? ?Plantar Fasciitis, bilaterally with underlying ankle equinus ?- XR reviewed as above.  ?- Educated on icing and stretching. Instructions given.  ?- Injection delivered to the plantar fascia as below. ?- DME: Plantar fascial brace dispensed to support the medial longitudinal arch of the foot and offload pressure from the heel and prevent arch collapse during weightbearing ?- Pharmacologic management: None ?-I discussed with the patient the driving forces ankle equinus to bilateral foot leading to excessive stress on the plantar fascia.  He states understanding ? ?Procedure: Injection Tendon/Ligament ?  Location: Bilateral plantar fascia at the glabrous junction; medial approach. ?Skin Prep: alcohol ?Injectate: 0.5 cc 0.5% marcaine plain, 0.5 cc of 1% Lidocaine, 0.5 cc kenalog 10. ?Disposition: Patient tolerated procedure well. Injection site dressed with a band-aid. ? ?No follow-ups on file. ?

## 2021-05-27 ENCOUNTER — Encounter: Payer: Self-pay | Admitting: Podiatry

## 2021-05-27 ENCOUNTER — Ambulatory Visit (INDEPENDENT_AMBULATORY_CARE_PROVIDER_SITE_OTHER): Payer: Medicare Other | Admitting: Podiatry

## 2021-05-27 DIAGNOSIS — M722 Plantar fascial fibromatosis: Secondary | ICD-10-CM

## 2021-05-27 DIAGNOSIS — Q666 Other congenital valgus deformities of feet: Secondary | ICD-10-CM | POA: Diagnosis not present

## 2021-05-27 NOTE — Progress Notes (Signed)
?Subjective:  ?Patient ID: Devon Price, male    DOB: [birth year],  MRN: [id number] ? ?Chief Complaint  ?Patient presents with  ? Plantar Fasciitis  ?  Pt stated that he still has some pain in his feet but the braces do help   ? ? ?61 y.o. male presents with the above complaint.  Patient presents with complaint of bilateral Planter fasciitis.  He states he is doing a little bit better the injection helped the bracing helped.  He still has some residual pain.  He would like to discuss further treatment options and would also like to obtain diabetic shoes and insoles to help with some of the pain.  He states understanding. ? ? ?Review of Systems: Negative except as noted in the HPI. Denies N/V/F/Ch. ? ?Past Medical History:  ?Diagnosis Date  ? Diabetes mellitus without complication (HCC)   ? Renal disorder   ? ? ?Current Outpatient Medications:  ?  azelastine (ASTELIN) 0.1 % nasal spray, USE 2 SPRAYS INTO EACH NOSTRIL TWICE A DAY, Disp: , Rfl:  ?  fluticasone-salmeterol (WIXELA INHUB) 100-50 MCG/ACT AEPB, INHALE 1 INHALATION INTO THE LUNGS EVERY 12 HOURS, Disp: , Rfl:  ?  furosemide (LASIX) 20 MG tablet, Take 20 mg by mouth daily as needed., Disp: , Rfl:  ?  metFORMIN (GLUCOPHAGE) 1000 MG tablet, Take 1,000 mg by mouth 2 (two) times daily with a meal., Disp: , Rfl:  ?  omeprazole (PRILOSEC) 20 MG capsule, SMARTSIG:1 Capsule(s) By Mouth Every Evening, Disp: , Rfl:  ?  oxyCODONE-acetaminophen (PERCOCET) 7.5-325 MG tablet, Take 1 tablet by mouth 4 (four) times daily as needed., Disp: , Rfl:  ?  tiZANidine (ZANAFLEX) 4 MG tablet, Take by mouth., Disp: , Rfl:  ?  triamcinolone cream (KENALOG) 0.1 %, Apply 1 application topically 2 (two) times daily., Disp: 30 g, Rfl: 0 ? ?Social History  ? ?Tobacco Use  ?Smoking Status Never  ?Smokeless Tobacco Never  ? ? ?No Known Allergies ?Objective:  ?There were no vitals filed for this visit. ?There is no height or weight on file to calculate BMI. ?Constitutional Well  developed. ?Well nourished.  ?Vascular Dorsalis pedis pulses palpable bilaterally. ?Posterior tibial pulses palpable bilaterally. ?Capillary refill normal to all digits.  ?No cyanosis or clubbing noted. ?Pedal hair growth normal.  ?Neurologic Normal speech. ?Oriented to person, place, and time. ?Epicritic sensation to light touch grossly present bilaterally.  ?Dermatologic Nails well groomed and normal in appearance. ?No open wounds. ?No skin lesions.  ?Orthopedic: Normal joint ROM without pain or crepitus bilaterally. ?No visible deformities. ?Tender to palpation at the calcaneal tuber bilaterally. ?No pain with calcaneal squeeze bilaterally. ?Ankle ROM diminished range of motion bilaterally. ?Silfverskiold Test: positive bilaterally. ? ?Gait examination shows pes planovalgus foot structure with calcaneovalgus to many toe signs unable to recruit the arch with dorsiflexion of the hallux.  Rigid deformity noted.  ? ?Radiographs: None ?Assessment:  ? ?1. Pes planovalgus   ?2. Plantar fasciitis, right   ?3. Plantar fasciitis, left   ? ? ?Plan:  ?Patient was evaluated and treated and all questions answered. ? ?Plantar Fasciitis, bilaterally with underlying ankle equinus ?- XR reviewed as above.  ?- Educated on icing and stretching. Instructions given.  ?-Second injection delivered to the plantar fascia as below. ?- DME: Continue plantar fascial brace wearing bilaterally x2 ?- Pharmacologic management: None ?-I discussed with the patient the driving forces ankle equinus to bilateral foot leading to excessive stress on the plantar fascia.  He states understanding ? ?  Pes planovalgus/foot deformity ?-I explained to patient the etiology of pes planovalgus and relationship with Planter fasciitis and various treatment options were discussed.  Given patient foot structure in the setting of Planter fasciitis I believe patient will benefit from custom-made orthotics to help control the hindfoot motion support the arch of the foot  and take the stress away from plantar fascial.  Patient will benefit from diabetic shoes. ?-He will be scheduled to get diabetic shoes. ? ? ?Procedure: Injection Tendon/Ligament ?Location: Bilateral plantar fascia at the glabrous junction; medial approach. ?Skin Prep: alcohol ?Injectate: 0.5 cc 0.5% marcaine plain, 0.5 cc of 1% Lidocaine, 0.5 cc kenalog 10. ?Disposition: Patient tolerated procedure well. Injection site dressed with a band-aid. ? ?No follow-ups on file. ?

## 2021-06-13 ENCOUNTER — Other Ambulatory Visit: Payer: Self-pay | Admitting: Podiatry

## 2021-06-13 ENCOUNTER — Ambulatory Visit: Payer: Medicare Other

## 2021-06-13 DIAGNOSIS — E1122 Type 2 diabetes mellitus with diabetic chronic kidney disease: Secondary | ICD-10-CM

## 2021-06-13 DIAGNOSIS — Q666 Other congenital valgus deformities of feet: Secondary | ICD-10-CM

## 2021-06-13 NOTE — Progress Notes (Signed)
SITUATION ?Reason for Consult: Evaluation for Prefabricated Diabetic Shoes and Custom Diabetic Inserts. ?Patient / Caregiver Report: Patient would like well fitting shoes ? ?OBJECTIVE DATA: ?Patient History / Diagnosis:  ?  ICD-10-CM   ?1. Type 2 diabetes mellitus with stage 3 chronic kidney disease, without long-term current use of insulin, unspecified whether stage 3a or 3b CKD ([place])  E11.22   ? N18.30   ?  ?2. Pes planovalgus  Q66.6   ?  ? ? ?Physician Treating Diabetes:  Ocie Cornfield. Ouida Sills, MD ? ?Current or Previous Devices:   None and no history ? ?In-Person Foot Examination: ?Ulcers & Callousing:   None ?Deformities:    Pes planus ?Sensation:    Compromised  ?Shoe Size:     10.5W ? ?ORTHOTIC RECOMMENDATION ?Recommended Devices: ?- 1x pair prefabricated PDAC approved diabetic shoes; Patient Selected [employer] [place] [employer] 521 Size 10.5W ?- 3x pair custom-to-patient PDAC approved vacuum formed diabetic insoles. ? ?GOALS OF SHOES AND INSOLES ?- Reduce shear and pressure ?- Reduce / Prevent callus formation ?- Reduce / Prevent ulceration ?- Protect the fragile healing compromised diabetic foot. ? ?Patient would benefit from diabetic shoes and inserts as patient has diabetes mellitus and the patient has one or more of the following conditions: ?- History of partial or complete amputation of the foot ?- History of previous foot ulceration. ?- History of pre-ulcerative callus ?- Peripheral neuropathy with evidence of callus formation ?- Foot deformity ?- Poor circulation ? ?ACTIONS PERFORMED ?Potential out of pocket cost was communicated to patient. Patient understood and consented to measurement and casting. Patient was casted for insoles via crush box and measured for shoes via brannock device. Procedure was explained and patient tolerated procedure well. All questions were answered and concerns addressed. Casts were shipped to central fabrication for HOLD until Certificate of Medical Necessity or otherwise  necessary authorization from insurance is obtained. ? ?PLAN ?Shoes are to be ordered and casts released from hold once all appropriate paperwork is complete. Patient is to be contacted and scheduled for fitting once shoes and insoles have been fabricated and received. ? ?

## 2021-06-26 ENCOUNTER — Encounter: Payer: Self-pay | Admitting: Podiatry

## 2021-06-26 ENCOUNTER — Ambulatory Visit (INDEPENDENT_AMBULATORY_CARE_PROVIDER_SITE_OTHER): Payer: Medicare Other | Admitting: Podiatry

## 2021-06-26 DIAGNOSIS — M722 Plantar fascial fibromatosis: Secondary | ICD-10-CM

## 2021-06-26 NOTE — Progress Notes (Signed)
?Subjective:  ?Patient ID: Devon Price, male    DOB: 1960/03/03,  MRN: [id number] ? ?Chief Complaint  ?Patient presents with  ? Plantar Fasciitis  ? ? ?61 y.o. male presents with the above complaint.  Patient presents with complaint of bilateral Planter fasciitis.  He states the injection does help.  He still has a little bit of residual pain.  He would like to know if he can do 1 last shot.  He states he is still awaiting his diabetic shoes which will help the rest of the way. ? ? ?Review of Systems: Negative except as noted in the HPI. Denies N/V/F/Ch. ? ?Past Medical History:  ?Diagnosis Date  ? Diabetes mellitus without complication (HCC)   ? Renal disorder   ? ? ?Current Outpatient Medications:  ?  azelastine (ASTELIN) 0.1 % nasal spray, USE 2 SPRAYS INTO EACH NOSTRIL TWICE A DAY, Disp: , Rfl:  ?  fluticasone-salmeterol (WIXELA INHUB) 100-50 MCG/ACT AEPB, INHALE 1 INHALATION INTO THE LUNGS EVERY 12 HOURS, Disp: , Rfl:  ?  furosemide (LASIX) 20 MG tablet, Take 20 mg by mouth daily as needed., Disp: , Rfl:  ?  metFORMIN (GLUCOPHAGE) 1000 MG tablet, Take 1,000 mg by mouth 2 (two) times daily with a meal., Disp: , Rfl:  ?  omeprazole (PRILOSEC) 20 MG capsule, SMARTSIG:1 Capsule(s) By Mouth Every Evening, Disp: , Rfl:  ?  oxyCODONE-acetaminophen (PERCOCET) 7.5-325 MG tablet, Take 1 tablet by mouth 4 (four) times daily as needed., Disp: , Rfl:  ?  tiZANidine (ZANAFLEX) 4 MG tablet, Take by mouth., Disp: , Rfl:  ?  triamcinolone cream (KENALOG) 0.1 %, Apply 1 application topically 2 (two) times daily., Disp: 30 g, Rfl: 0 ? ?Social History  ? ?Tobacco Use  ?Smoking Status Never  ?Smokeless Tobacco Never  ? ? ?No Known Allergies ?Objective:  ?There were no vitals filed for this visit. ?There is no height or weight on file to calculate BMI. ?Constitutional Well developed. ?Well nourished.  ?Vascular Dorsalis pedis pulses palpable bilaterally. ?Posterior tibial pulses palpable bilaterally. ?Capillary refill  normal to all digits.  ?No cyanosis or clubbing noted. ?Pedal hair growth normal.  ?Neurologic Normal speech. ?Oriented to person, place, and time. ?Epicritic sensation to light touch grossly present bilaterally.  ?Dermatologic Nails well groomed and normal in appearance. ?No open wounds. ?No skin lesions.  ?Orthopedic: Normal joint ROM without pain or crepitus bilaterally. ?No visible deformities. ?Tender to palpation at the calcaneal tuber bilaterally. ?No pain with calcaneal squeeze bilaterally. ?Ankle ROM diminished range of motion bilaterally. ?Silfverskiold Test: positive bilaterally. ? ?Gait examination shows pes planovalgus foot structure with calcaneovalgus to many toe signs unable to recruit the arch with dorsiflexion of the hallux.  Rigid deformity noted.  ? ?Radiographs: None ?Assessment:  ? ?No diagnosis found. ? ? ?Plan:  ?Patient was evaluated and treated and all questions answered. ? ?Plantar Fasciitis, bilaterally with underlying ankle equinus ?- XR reviewed as above.  ?- Educated on icing and stretching. Instructions given.  ?-Third injection delivered to the plantar fascia as below. ?- DME: Continue plantar fascial brace wearing bilaterally x2 ?- Pharmacologic management: None ?-I discussed with the patient the driving forces ankle equinus to bilateral foot leading to excessive stress on the plantar fascia.  He states understanding ? ?Pes planovalgus/foot deformity ?-I explained to patient the etiology of pes planovalgus and relationship with Planter fasciitis and various treatment options were discussed.  Given patient foot structure in the setting of Planter fasciitis I believe patient will  benefit from custom-made orthotics to help control the hindfoot motion support the arch of the foot and take the stress away from plantar fascial.  Patient will benefit from diabetic shoes. ?-He is awaiting his diabetic shoes ? ? ?Procedure: Injection Tendon/Ligament ?Location: Bilateral plantar fascia at the  glabrous junction; medial approach. ?Skin Prep: alcohol ?Injectate: 0.5 cc 0.5% marcaine plain, 0.5 cc of 1% Lidocaine, 0.5 cc kenalog 10. ?Disposition: Patient tolerated procedure well. Injection site dressed with a band-aid. ? ?No follow-ups on file. ?

## 2021-06-30 ENCOUNTER — Telehealth: Payer: Self-pay

## 2021-06-30 NOTE — Telephone Encounter (Signed)
Shoes ordered - [place] 521 10.5W ?

## 2021-07-28 ENCOUNTER — Telehealth: Payer: Self-pay

## 2021-07-28 NOTE — Telephone Encounter (Signed)
Attempted to leave voicemail, mailbox is full and cannot accept messages. Shoes and insoles ready for fitting

## 2021-09-08 ENCOUNTER — Other Ambulatory Visit: Payer: Medicare Other

## 2021-09-08 ENCOUNTER — Ambulatory Visit (INDEPENDENT_AMBULATORY_CARE_PROVIDER_SITE_OTHER): Payer: Medicare Other

## 2021-09-08 DIAGNOSIS — E1140 Type 2 diabetes mellitus with diabetic neuropathy, unspecified: Secondary | ICD-10-CM

## 2021-09-08 DIAGNOSIS — Q666 Other congenital valgus deformities of feet: Secondary | ICD-10-CM

## 2021-09-08 DIAGNOSIS — M2141 Flat foot [pes planus] (acquired), right foot: Secondary | ICD-10-CM

## 2021-09-08 DIAGNOSIS — E1122 Type 2 diabetes mellitus with diabetic chronic kidney disease: Secondary | ICD-10-CM

## 2021-09-08 DIAGNOSIS — N183 Chronic kidney disease, stage 3 unspecified: Secondary | ICD-10-CM

## 2021-09-08 DIAGNOSIS — M2142 Flat foot [pes planus] (acquired), left foot: Secondary | ICD-10-CM

## 2021-09-08 NOTE — Progress Notes (Signed)
Reason for Visit:                   Fitting of Diabetic Shoes & Insoles Patient / Caregiver Report:     Patient is satisfied with fit and function of shoes and insoles.  ACTIONS PERFORMED Shoes and insoles were verified for structural integrity and safety. Patient wore shoes and insoles in office. Skin was inspected and free of areas of concern after wearing shoes and inserts. Shoes and inserts fit properly.  Patient / Caregiver provided with verbal instruction and demonstration regarding wear, care, proper fit, function, purpose, cleaning, and use of shoes and insoles and in all related precautions and risks and benefits regarding shoes and insoles. Patient / Caregiver was instructed to wear properly fitting socks with shoes at all times. Patient was also provided with verbal instruction regarding how to report any failures or malfunctions of shoes or inserts, and necessary follow up care. Patient / Caregiver was also instructed to contact physician regarding change in status that may affect function of shoes and inserts.    Patient / Caregiver verbalized undersatnding of instruction provided. Patient / Caregiver demonstrated independence with proper donning and doffing of shoes and inserts.   PLAN Patient is to follow up in one week or as necessary (PRN). All questions were answered and concerns addressed.  

## 2021-10-02 ENCOUNTER — Encounter: Payer: Self-pay | Admitting: Podiatry

## 2021-10-02 ENCOUNTER — Ambulatory Visit (INDEPENDENT_AMBULATORY_CARE_PROVIDER_SITE_OTHER): Payer: Medicare Other | Admitting: Podiatry

## 2021-10-02 DIAGNOSIS — M79675 Pain in left toe(s): Secondary | ICD-10-CM

## 2021-10-02 DIAGNOSIS — B351 Tinea unguium: Secondary | ICD-10-CM | POA: Diagnosis not present

## 2021-10-02 DIAGNOSIS — E1122 Type 2 diabetes mellitus with diabetic chronic kidney disease: Secondary | ICD-10-CM

## 2021-10-02 DIAGNOSIS — N182 Chronic kidney disease, stage 2 (mild): Secondary | ICD-10-CM

## 2021-10-02 DIAGNOSIS — M79674 Pain in right toe(s): Secondary | ICD-10-CM

## 2021-10-02 DIAGNOSIS — N183 Chronic kidney disease, stage 3 unspecified: Secondary | ICD-10-CM

## 2021-10-02 NOTE — Progress Notes (Signed)
This patient returns to my office for at risk foot care.  This patient requires this care by a professional since this patient will be at risk due to having diabetes and CKD.  This patient is unable to cut nails himself since the patient cannot reach his nails.These nails are painful walking and wearing shoes.  This patient presents for at risk foot care today.  General Appearance  Alert, conversant and in no acute stress.  Vascular  Dorsalis pedis and posterior tibial  pulses are palpable  bilaterally.  Capillary return is within normal limits  bilaterally. Temperature is within normal limits  bilaterally.  Neurologic  Senn-Weinstein monofilament wire test diminished  bilaterally. Muscle power within normal limits bilaterally.  Nails Thick disfigured discolored nails with subungual debris  from hallux to fifth toes bilaterally. No evidence of bacterial infection or drainage bilaterally.  Orthopedic  No limitations of motion  feet .  No crepitus or effusions noted.  No bony pathology or digital deformities noted. HAV  B/L.  Skin  normotropic skin with no porokeratosis noted bilaterally.  No signs of infections or ulcers noted.     Onychomycosis  Pain in right toes  Pain in left toes  Consent was obtained for treatment procedures.   Mechanical debridement of nails 1-5  bilaterally performed with a nail nipper.  Filed with dremel without incident.    Return office visit     4 months                 Told patient to return for periodic foot care and evaluation due to potential at risk complications.   Helane Gunther DPM

## 2021-10-08 ENCOUNTER — Emergency Department
Admission: EM | Admit: 2021-10-08 | Discharge: 2021-10-08 | Disposition: A | Discharge status: Home / Self Care | Payer: Medicare Other | Attending: Emergency Medicine | Admitting: Emergency Medicine

## 2021-10-08 ENCOUNTER — Other Ambulatory Visit: Payer: Self-pay

## 2021-10-08 ENCOUNTER — Encounter: Payer: Self-pay | Admitting: Emergency Medicine

## 2021-10-08 ENCOUNTER — Emergency Department: Payer: Medicare Other

## 2021-10-08 DIAGNOSIS — R0609 Other forms of dyspnea: Secondary | ICD-10-CM | POA: Insufficient documentation

## 2021-10-08 DIAGNOSIS — R109 Unspecified abdominal pain: Secondary | ICD-10-CM | POA: Insufficient documentation

## 2021-10-08 DIAGNOSIS — E119 Type 2 diabetes mellitus without complications: Secondary | ICD-10-CM | POA: Diagnosis not present

## 2021-10-08 LAB — COMPREHENSIVE METABOLIC PANEL
ALT: 21 U/L (ref 0–44)
AST: 22 U/L (ref 15–41)
Albumin: 3.9 g/dL (ref 3.5–5.0)
Alkaline Phosphatase: 86 U/L (ref 38–126)
Anion gap: 9 (ref 5–15)
BUN: 16 mg/dL (ref 8–23)
CO2: 25 mmol/L (ref 22–32)
Calcium: 9.7 mg/dL (ref 8.9–10.3)
Chloride: 104 mmol/L (ref 98–111)
Creatinine, Ser: 1.28 mg/dL — ABNORMAL HIGH (ref 0.61–1.24)
GFR, Estimated: >60 mL/min (ref >60)
Glucose, Bld: 95 mg/dL (ref 70–99)
Potassium: 3.9 mmol/L (ref 3.5–5.1)
Sodium: 138 mmol/L (ref 135–145)
Total Bilirubin: 0.8 mg/dL (ref 0.3–1.2)
Total Protein: 7.4 g/dL (ref 6.5–8.1)

## 2021-10-08 LAB — URINALYSIS, ROUTINE W REFLEX MICROSCOPIC
Bilirubin Urine: NEGATIVE
Glucose, UA: NEGATIVE mg/dL
Hgb urine dipstick: NEGATIVE
Ketones, ur: NEGATIVE mg/dL
Leukocytes,Ua: NEGATIVE
Nitrite: NEGATIVE
Protein, ur: NEGATIVE mg/dL
Specific Gravity, Urine: 1.017 (ref 1.005–1.030)
pH: 5 (ref 5.0–8.0)

## 2021-10-08 LAB — CBC WITH DIFFERENTIAL/PLATELET
Abs Immature Granulocytes: 0.01 10*3/uL (ref 0.00–0.07)
Basophils Absolute: 0 10*3/uL (ref 0.0–0.1)
Basophils Relative: 1 %
Eosinophils Absolute: 0.6 10*3/uL — ABNORMAL HIGH (ref 0.0–0.5)
Eosinophils Relative: 9 %
HCT: 44.7 % (ref 39.0–52.0)
Hemoglobin: 14.3 g/dL (ref 13.0–17.0)
Immature Granulocytes: 0 %
Lymphocytes Relative: 30 %
Lymphs Abs: 2 10*3/uL (ref 0.7–4.0)
MCH: 26.2 pg (ref 26.0–34.0)
MCHC: 32 g/dL (ref 30.0–36.0)
MCV: 82 fL (ref 80.0–100.0)
Monocytes Absolute: 0.5 10*3/uL (ref 0.1–1.0)
Monocytes Relative: 8 %
Neutro Abs: 3.6 10*3/uL (ref 1.7–7.7)
Neutrophils Relative %: 52 %
Platelets: 168 10*3/uL (ref 150–400)
RBC: 5.45 MIL/uL (ref 4.22–5.81)
RDW: 13 % (ref 11.5–15.5)
WBC: 6.8 10*3/uL (ref 4.0–10.5)
nRBC: 0 % (ref 0.0–0.2)

## 2021-10-08 LAB — LIPASE, BLOOD: Lipase: 43 U/L (ref 11–51)

## 2021-10-08 LAB — TROPONIN I (HIGH SENSITIVITY)
Troponin I (High Sensitivity): 4 ng/L (ref <18)
Troponin I (High Sensitivity): 4 ng/L (ref <18)

## 2021-10-08 NOTE — ED Provider Notes (Signed)
[facility] Provider Note    Event Date/Time   First MD Initiated Contact with Patient 10/08/21 [phone]     (approximate)   History   Abdominal Pain   HPI  Devon Price is a 61 y.o. male history of diabetes and renal disease who presents with Dsypnea on exertion yesterday and this morning.  2 discrete episodes lasting no more than 30 minutes each time with no associated chest pain.  Completely resolved at this time.  He has no other complaints including no recent illnesses, denies respiratory infectious symptoms, trauma,  Denies edema. Non smoker.  No history of of blood clots.   Completely resolved at this time, he is looking forward to going home.     Physical Exam   Triage Vital Signs: ED Triage Vitals  Enc Vitals Group     BP 10/08/21 0317 123/76     Pulse Rate 10/08/21 0317 99     Resp 10/08/21 0317 20     Temp 10/08/21 0317 97.7 F (36.5 C)     Temp Source 10/08/21 0317 Oral     SpO2 10/08/21 0312 99 %     Weight 10/08/21 0317 222 lb (100.7 kg)     Height 10/08/21 0317 5\' 6"  (1.676 m)     Head Circumference --      Peak Flow --      Pain Score 10/08/21 0317 4     Pain Loc --      Pain Edu? --      Excl. in GC? --     Most recent vital signs: Vitals:   10/08/21 0639 10/08/21 0852  BP: 109/69 125/76  Pulse: 90 87  Resp: 18 18  Temp: 98 F (36.7 C)   SpO2: 95% 96%     General: Awake, no distress.  CV:  Good peripheral perfusion.  No edema.  Regular rate and rhythm, radial pulses equal bilaterally. Resp:  Normal effort.  There to auscultation bilaterally without focality or wheezing. Abd:  No distention.  Nontender.  ED Results / Procedures / Treatments   Labs (all labs ordered are listed, but only abnormal results are displayed) Labs Reviewed  CBC WITH DIFFERENTIAL/PLATELET - Abnormal; Notable for the following components:      Result Value   Eosinophils Absolute 0.6 (*)    All other components within normal  limits  COMPREHENSIVE METABOLIC PANEL - Abnormal; Notable for the following components:   Creatinine, Ser 1.28 (*)    All other components within normal limits  URINALYSIS, ROUTINE W REFLEX MICROSCOPIC - Abnormal; Notable for the following components:   Color, Urine YELLOW (*)    APPearance CLEAR (*)    All other components within normal limits  LIPASE, BLOOD  TROPONIN I (HIGH SENSITIVITY)  TROPONIN I (HIGH SENSITIVITY)     EKG ED ECG REPORT I, 10/10/21, the attending physician, personally viewed and interpreted this ECG.   Date: 10/08/2021  EKG Time: 0322  Rate: 93  Rhythm: normal EKG, normal sinus rhythm, unchanged from previous tracings  Axis: normal  Intervals:noraml QT  ST&T Change: non ischemic   RADIOLOGY Independently reviewed and interpreted chest x-ray and see no focal consolidations or pneumothorax.   PROCEDURES:  Critical Care performed: No  Procedures   MEDICATIONS ORDERED IN ED: Medications - No data to display   IMPRESSION / MDM / ASSESSMENT AND PLAN / ED COURSE  I reviewed the triage vital signs and the nursing notes.  Differential diagnosis includes, but is not limited to ACS, heart failure, respiratory infection, pneumothorax, stated though less likely pulmonary embolism.  Patient's presentation is most consistent with acute presentation with potential threat to life or bodily function.  Patient's symptoms were transient and completely resolved at this time.  Troponins negative, EKG nonischemic, but dynamics appropriate and reassuring and she is ambulating in the emergency department without symptoms.  Considered admission given his age and risk factors for cardiac etiology but with normal troponins and completely resolved symptoms, engaged in shared decision-making with the patient and he would like to go home and follow-up outpatient.    FINAL CLINICAL IMPRESSION(S) / ED DIAGNOSES   Final diagnoses:  Dyspnea  on exertion     Rx / DC Orders   ED Discharge Orders     None        Note:  This document was prepared using Dragon voice recognition software and may include unintentional dictation errors.    Pilar Jarvis, MD 10/08/21 [phone]

## 2021-10-08 NOTE — ED Notes (Signed)
D/C, f/up and reasons to return discussed with pt and family, both verbalized understanding. NAD noted. Pt ambulatory with steady gait on D/C.

## 2021-10-08 NOTE — ED Notes (Signed)
Pt presents to ED via EMS with complaints of intermittent SOB episodes upon exertion. Pt denies any HX of COPD or asthma. Pt is able to speak in full sentences with no distress.   Pt does also endorse intermittent ABD pain. Pt states HX of constipation and states he takes metformin and recently started taking Ozempic a once a week injection to help with his diabetes and weight loss. Pt ambulatory to hallway bed with steady gait. Pt is A&Ox4 at this time.

## 2021-10-14 ENCOUNTER — Inpatient Hospital Stay: Admission: RE | Admit: 2021-10-14 | Payer: Medicare Other | Source: Ambulatory Visit

## 2021-11-20 ENCOUNTER — Ambulatory Visit (INDEPENDENT_AMBULATORY_CARE_PROVIDER_SITE_OTHER): Payer: Medicare Other

## 2021-11-20 DIAGNOSIS — N183 Chronic kidney disease, stage 3 unspecified: Secondary | ICD-10-CM

## 2021-11-20 DIAGNOSIS — Q666 Other congenital valgus deformities of feet: Secondary | ICD-10-CM

## 2021-11-20 DIAGNOSIS — E1122 Type 2 diabetes mellitus with diabetic chronic kidney disease: Secondary | ICD-10-CM

## 2021-11-20 NOTE — Progress Notes (Signed)
Patient presents to the office today for diabetic shoe and insole measuring.  Patient was measured with brannock device to determine size and width for 1 pair of extra depth shoes and foam casted for 3 pair of insoles.   Documentation of medical necessity will be sent to patient's treating diabetic doctor to verify and sign.   Patient's diabetic provider: Frazier Richards, MD  Shoes and insoles will be ordered at that time and patient will be notified for an appointment for fitting when they arrive.   Shoe size (per patient): 10.5 wide (2E) men's   Brannock measurement: 10 wide men's  Patient shoe selection-   Shoe choice:   X801M Apex  Shoe size ordered: 10.5 wide men's

## 2022-01-12 ENCOUNTER — Ambulatory Visit (INDEPENDENT_AMBULATORY_CARE_PROVIDER_SITE_OTHER): Payer: Medicare Other | Admitting: Podiatry

## 2022-01-12 ENCOUNTER — Encounter: Payer: Self-pay | Admitting: Podiatry

## 2022-01-12 DIAGNOSIS — M79675 Pain in left toe(s): Secondary | ICD-10-CM

## 2022-01-12 DIAGNOSIS — B351 Tinea unguium: Secondary | ICD-10-CM

## 2022-01-12 DIAGNOSIS — E1122 Type 2 diabetes mellitus with diabetic chronic kidney disease: Secondary | ICD-10-CM

## 2022-01-12 DIAGNOSIS — Q666 Other congenital valgus deformities of feet: Secondary | ICD-10-CM | POA: Diagnosis not present

## 2022-01-12 DIAGNOSIS — M79674 Pain in right toe(s): Secondary | ICD-10-CM

## 2022-01-12 DIAGNOSIS — N183 Chronic kidney disease, stage 3 unspecified: Secondary | ICD-10-CM

## 2022-01-12 DIAGNOSIS — N182 Chronic kidney disease, stage 2 (mild): Secondary | ICD-10-CM | POA: Diagnosis not present

## 2022-01-12 NOTE — Progress Notes (Signed)
This patient returns to my office for at risk foot care.  This patient requires this care by a professional since this patient will be at risk due to having diabetes and CKD.  This patient is unable to cut nails himself since the patient cannot reach his nails.These nails are painful walking and wearing shoes.  This patient presents for at risk foot care today.  General Appearance  Alert, conversant and in no acute stress.  Vascular  Dorsalis pedis and posterior tibial  pulses are palpable  bilaterally.  Capillary return is within normal limits  bilaterally. Temperature is within normal limits  bilaterally.  Neurologic  Senn-Weinstein monofilament wire test diminished  bilaterally. Muscle power within normal limits bilaterally.  Nails Thick disfigured discolored nails with subungual debris  from hallux to fifth toes bilaterally. No evidence of bacterial infection or drainage bilaterally.  Orthopedic  No limitations of motion  feet .  No crepitus or effusions noted.  No bony pathology or digital deformities noted. HAV  B/L.  Skin  normotropic skin with no porokeratosis noted bilaterally.  No signs of infections or ulcers noted.     Onychomycosis  Pain in right toes  Pain in left toes  Consent was obtained for treatment procedures.   Mechanical debridement of nails 1-5  bilaterally performed with a nail nipper.  Filed with dremel without incident.    Return office visit     3 months                 Told patient to return for periodic foot care and evaluation due to potential at risk complications.   Uziah Sorter DPM   

## 2022-01-30 ENCOUNTER — Telehealth: Payer: Self-pay | Admitting: Podiatry

## 2022-01-30 NOTE — Telephone Encounter (Signed)
Left message on voicemail that patients diabetic shoes are in - (currently in [place] being sent to [place] )

## 2022-02-02 ENCOUNTER — Ambulatory Visit (INDEPENDENT_AMBULATORY_CARE_PROVIDER_SITE_OTHER): Payer: Medicare Other | Admitting: *Deleted

## 2022-02-02 DIAGNOSIS — Q666 Other congenital valgus deformities of feet: Secondary | ICD-10-CM | POA: Diagnosis not present

## 2022-02-02 DIAGNOSIS — E1122 Type 2 diabetes mellitus with diabetic chronic kidney disease: Secondary | ICD-10-CM | POA: Diagnosis not present

## 2022-02-02 DIAGNOSIS — N183 Chronic kidney disease, stage 3 unspecified: Secondary | ICD-10-CM

## 2022-02-02 NOTE — Progress Notes (Signed)
Patient presents today to pick up diabetic shoes and insoles.  Patient was dispensed 1 pair of diabetic shoes and 3 pairs of foam casted diabetic insoles. Fit was satisfactory. Instructions for break-in and wear was reviewed and a copy was given to the patient.   Re-appointment for regularly scheduled diabetic foot care visits or if they should experience any trouble with the shoes or insoles.  

## 2022-02-19 ENCOUNTER — Ambulatory Visit: Payer: Medicare Other | Admitting: Podiatry

## 2022-04-16 ENCOUNTER — Ambulatory Visit (INDEPENDENT_AMBULATORY_CARE_PROVIDER_SITE_OTHER): Payer: 59 | Admitting: Podiatry

## 2022-04-16 ENCOUNTER — Encounter: Payer: Self-pay | Admitting: Podiatry

## 2022-04-16 VITALS — BP 134/77 | HR 90

## 2022-04-16 DIAGNOSIS — N183 Chronic kidney disease, stage 3 unspecified: Secondary | ICD-10-CM | POA: Diagnosis not present

## 2022-04-16 DIAGNOSIS — M79675 Pain in left toe(s): Secondary | ICD-10-CM

## 2022-04-16 DIAGNOSIS — B351 Tinea unguium: Secondary | ICD-10-CM

## 2022-04-16 DIAGNOSIS — M79674 Pain in right toe(s): Secondary | ICD-10-CM

## 2022-04-16 DIAGNOSIS — E1122 Type 2 diabetes mellitus with diabetic chronic kidney disease: Secondary | ICD-10-CM

## 2022-04-16 NOTE — Progress Notes (Signed)
This patient returns to my office for at risk foot care.  This patient requires this care by a professional since this patient will be at risk due to having diabetes and CKD.  This patient is unable to cut nails himself since the patient cannot reach his nails.These nails are painful walking and wearing shoes.  This patient presents for at risk foot care today.  General Appearance  Alert, conversant and in no acute stress.  Vascular  Dorsalis pedis and posterior tibial  pulses are palpable  bilaterally.  Capillary return is within normal limits  bilaterally. Temperature is within normal limits  bilaterally.  Neurologic  Senn-Weinstein monofilament wire test diminished  bilaterally. Muscle power within normal limits bilaterally.  Nails Thick disfigured discolored nails with subungual debris  from hallux to fifth toes bilaterally. No evidence of bacterial infection or drainage bilaterally.  Orthopedic  No limitations of motion  feet .  No crepitus or effusions noted.  No bony pathology or digital deformities noted. HAV  B/L.  Skin  normotropic skin with no porokeratosis noted bilaterally.  No signs of infections or ulcers noted.     Onychomycosis  Pain in right toes  Pain in left toes  Consent was obtained for treatment procedures.   Mechanical debridement of nails 1-5  bilaterally performed with a nail nipper.  Filed with dremel without incident.    Return office visit     3 months                 Told patient to return for periodic foot care and evaluation due to potential at risk complications.   Gardiner Barefoot DPM

## 2022-04-17 ENCOUNTER — Telehealth: Payer: Self-pay | Admitting: Podiatry

## 2022-04-17 NOTE — Telephone Encounter (Signed)
Patient called this morning regarding getting a bill for his diabetic shoes.  Patient states he was told that he could get 2 pairs of shoes a soft pair and tennis shoes.   I tried to explain to patient that you can not get 2 pairs of shoes through Medicare, he said it was our fault because the girl told him he could get 2 pair. He wants to charges removed from his account, he would like a call back from office manager and billing.

## 2022-07-16 ENCOUNTER — Encounter: Payer: Self-pay | Admitting: Podiatry

## 2022-07-16 ENCOUNTER — Ambulatory Visit (INDEPENDENT_AMBULATORY_CARE_PROVIDER_SITE_OTHER): Payer: 59 | Admitting: Podiatry

## 2022-07-16 VITALS — BP 130/74

## 2022-07-16 DIAGNOSIS — B351 Tinea unguium: Secondary | ICD-10-CM

## 2022-07-16 DIAGNOSIS — M79674 Pain in right toe(s): Secondary | ICD-10-CM

## 2022-07-16 DIAGNOSIS — Q666 Other congenital valgus deformities of feet: Secondary | ICD-10-CM

## 2022-07-16 DIAGNOSIS — M79675 Pain in left toe(s): Secondary | ICD-10-CM | POA: Diagnosis not present

## 2022-07-16 DIAGNOSIS — L600 Ingrowing nail: Secondary | ICD-10-CM

## 2022-07-16 DIAGNOSIS — E119 Type 2 diabetes mellitus without complications: Secondary | ICD-10-CM | POA: Diagnosis not present

## 2022-07-20 NOTE — Progress Notes (Signed)
ANNUAL DIABETIC FOOT EXAM  Subjective: Devon Price presents today annual diabetic foot exam.  Chief Complaint  Patient presents with   Nail Problem    DFC,B/S:91,A1C:  low 6's, not sure ,Referring Provider Lauro Regulus, MD,LOV:02/24      Patient confirms h/o diabetes.  Patient denies any h/o foot wounds.  Patient denies any numbness, tingling, burning, or pins/needle sensation in feet.  Risk factors: diabetes, HTN, CKD, hypercholesterolemia.  Lauro Regulus, MD is patient's PCP.  Past Medical History:  Diagnosis Date   Diabetes mellitus without complication (HCC)    Renal disorder    Patient Active Problem List   Diagnosis Date Noted   Pain due to onychomycosis of toenails of both feet 03/10/2021   Plantar fasciitis, bilateral 03/10/2021   Essential hypertension 04/16/2020   Palpitations 04/10/2019   Chronic migraine without aura 03/11/2017   Allergic rhinitis 07/06/2016   Health care maintenance 07/06/2016   Hypercholesterolemia 07/06/2016   Type 2 diabetes mellitus with stage 3 chronic kidney disease, without long-term current use of insulin (HCC) 07/06/2016   Acute idiopathic pericarditis 09/29/2015   BPH with obstruction/lower urinary tract symptoms 03/29/2013   Chronic kidney disease, stage II (mild) 03/29/2013   Incomplete emptying of bladder 03/29/2013   Kidney stone 03/29/2013   Unspecified renal colic 03/29/2013   Cervical spinal stenosis 09/21/2012   Facet hypertrophy of lumbar region 09/21/2012   Obesity, morbid (HCC) 09/21/2012   Work related injury 09/21/2012   History reviewed. No pertinent surgical history. Current Outpatient Medications on File Prior to Visit  Medication Sig Dispense Refill   MOUNJARO 10 MG/0.5ML Pen Inject 10 mg into the skin once a week.     azelastine (ASTELIN) 0.1 % nasal spray USE 2 SPRAYS INTO EACH NOSTRIL TWICE A DAY     fluticasone-salmeterol (WIXELA INHUB) 100-50 MCG/ACT AEPB INHALE 1 INHALATION  INTO THE LUNGS EVERY 12 HOURS     furosemide (LASIX) 20 MG tablet Take 20 mg by mouth daily as needed.     metFORMIN (GLUCOPHAGE) 1000 MG tablet Take 1,000 mg by mouth 2 (two) times daily with a meal.     omeprazole (PRILOSEC) 20 MG capsule SMARTSIG:1 Capsule(s) By Mouth Every Evening     oxyCODONE-acetaminophen (PERCOCET) 7.5-325 MG tablet Take 1 tablet by mouth 4 (four) times daily as needed.     tiZANidine (ZANAFLEX) 4 MG tablet Take by mouth.     triamcinolone cream (KENALOG) 0.1 % Apply 1 application topically 2 (two) times daily. 30 g 0   No current facility-administered medications on file prior to visit.    No Known Allergies Social History   Occupational History   Not on file  Tobacco Use   Smoking status: Never   Smokeless tobacco: Never  Vaping Use   Vaping Use: Never used  Substance and Sexual Activity   Alcohol use: No   Drug use: No   Sexual activity: Not on file   History reviewed. No pertinent family history. Immunization History  Administered Date(s) Administered   PFIZER Comirnaty(Gray Top)Covid-19 Tri-Sucrose Vaccine 05/25/2019, 06/20/2019, 01/01/2020   PFIZER(Purple Top)SARS-COV-2 Vaccination 05/25/2019, 06/20/2019     Review of Systems: Negative except as noted in the HPI.   Objective: Vitals:   07/16/22 1033  BP: 130/74    Devon Price is a pleasant 62 y.o. male in NAD. AAO X 3.  Vascular Examination: Capillary refill time immediate b/l. Vascular status intact b/l with palpable pedal pulses. Pedal hair present b/l. No pain with  calf compression b/l. Skin temperature gradient WNL b/l. No cyanosis or clubbing b/l. No ischemia or gangrene noted b/l.   Neurological Examination: Sensation grossly intact b/l with 10 gram monofilament. Vibratory sensation intact b/l.   Dermatological Examination: Pedal skin with normal turgor, texture and tone b/l.  No open wounds. No interdigital macerations.   Toenails 1-5 b/l thick, discolored, elongated  with subungual debris and pain on dorsal palpation.   Incurvated nailplate both borders of left hallux and both borders of right hallux.  Nail border hypertrophy absent. There is tenderness to palpation. Sign(s) of infection: no clinical signs of infection noted on examination today..  Musculoskeletal Examination: Normal muscle strength 5/5 to all lower extremity muscle groups bilaterally. No pain, crepitus or joint limitation noted with ROM b/l LE. No gross bony pedal deformities b/l. Patient ambulates independently without assistive aids.  Radiographs: None  ADA Risk Categorization: Low Risk :  Patient has all of the following: Intact protective sensation No prior foot ulcer  No severe deformity Pedal pulses present  Assessment: 1. Pain due to onychomycosis of toenails of both feet   2. Ingrown toenail without infection   3. Pes planovalgus   4. Controlled type 2 diabetes mellitus without complication, without long-term current use of insulin (HCC)   5. Encounter for diabetic foot exam [facility])     Plan: -Patient was evaluated and treated. All patient's and/or POA's questions/concerns answered on today's visit. -Diabetic foot examination performed today. -Continue diabetic foot care principles: inspect feet daily, monitor glucose as recommended by PCP and/or Endocrinologist, and follow prescribed diet per PCP, Endocrinologist and/or dietician. -Patient to continue soft, supportive shoe gear daily. -Toenails 1-5 bilaterally were debrided in length and girth with sterile nail nippers and dremel. Pinpoint bleeding of right great toe addressed with Lumicain Hemostatic Solution, cleansed with alcohol. Triple antibiotic ointment applied. Patient/careigver instructed to apply Neosporin Cream once daily for 7 days. -Patient/POA to call should there be question/concern in the interim. Return in about 3 months (around 10/16/2022).  [name], DPM

## 2022-10-19 ENCOUNTER — Ambulatory Visit (INDEPENDENT_AMBULATORY_CARE_PROVIDER_SITE_OTHER): Payer: 59 | Admitting: Podiatry

## 2022-10-19 ENCOUNTER — Encounter: Payer: Self-pay | Admitting: Podiatry

## 2022-10-19 DIAGNOSIS — E1122 Type 2 diabetes mellitus with diabetic chronic kidney disease: Secondary | ICD-10-CM | POA: Diagnosis not present

## 2022-10-19 DIAGNOSIS — N183 Chronic kidney disease, stage 3 unspecified: Secondary | ICD-10-CM

## 2022-10-19 DIAGNOSIS — M79674 Pain in right toe(s): Secondary | ICD-10-CM

## 2022-10-19 DIAGNOSIS — M79675 Pain in left toe(s): Secondary | ICD-10-CM | POA: Diagnosis not present

## 2022-10-19 DIAGNOSIS — B351 Tinea unguium: Secondary | ICD-10-CM

## 2022-10-19 NOTE — Progress Notes (Signed)
  Subjective:  Patient ID: Devon Price, male    DOB: [birth year],  MRN: [id number]  Devon Price presents to clinic today for: at risk foot care. Pt has h/o NIDDM with chronic kidney disease and painful thick toenails that are difficult to trim. Pain interferes with ambulation. Aggravating factors include wearing enclosed shoe gear. Pain is relieved with periodic professional debridement.  Chief Complaint  Patient presents with   Nail Problem    DFC,Referring Provider Lauro Regulus, MD,lov:07/24,A1C:6.1,BS:105      PCP is Lauro Regulus, MD.  No Known Allergies  Review of Systems: Negative except as noted in the HPI.  Objective: No changes noted in today's physical examination. There were no vitals filed for this visit.  Devon Price is a pleasant 62 y.o. male in NAD. AAO x 3.  Vascular Examination: Capillary refill time <3 seconds b/l LE. Palpable pedal pulses b/l LE. Digital hair present b/l. No pedal edema b/l. Skin temperature gradient WNL b/l. No varicosities b/l. No cyanosis or clubbing noted b/l LE.Marland Kitchen  Dermatological Examination: Pedal skin with normal turgor, texture and tone b/l. No open wounds. No interdigital macerations b/l. Toenails 1-5 b/l thickened, discolored, dystrophic with subungual debris. There is pain on palpation to dorsal aspect of nailplates. No corns, calluses nor porokeratotic lesions noted..  Neurological Examination: Protective sensation intact with 10 gram monofilament b/l LE. Vibratory sensation intact b/l LE.   Musculoskeletal Examination: Muscle strength 5/5 to all lower extremity muscle groups bilaterally. No pain, crepitus or joint limitation noted with ROM bilateral LE. No gross bony deformities bilaterally. Patient ambulates independent of any assistive aids.  Assessment/Plan: 1. Pain due to onychomycosis of toenails of both feet   2. Type 2 diabetes mellitus with stage 3 chronic kidney disease, without  long-term current use of insulin, unspecified whether stage 3a or 3b CKD (HCC)     -Consent given for treatment as described below: -Examined patient. -Patient to continue soft, supportive shoe gear daily. -Mycotic toenails 1-5 bilaterally were debrided in length and girth with sterile nail nippers and dremel without incident. -Patient/POA to call should there be question/concern in the interim.   Return in about 3 months (around 01/19/2023).  [name], DPM

## 2023-01-07 ENCOUNTER — Encounter: Payer: Self-pay | Admitting: Internal Medicine

## 2023-01-07 DIAGNOSIS — E7800 Pure hypercholesterolemia, unspecified: Secondary | ICD-10-CM

## 2023-01-15 ENCOUNTER — Other Ambulatory Visit: Payer: Self-pay | Admitting: Internal Medicine

## 2023-01-15 DIAGNOSIS — E7800 Pure hypercholesterolemia, unspecified: Secondary | ICD-10-CM

## 2023-01-28 ENCOUNTER — Ambulatory Visit (INDEPENDENT_AMBULATORY_CARE_PROVIDER_SITE_OTHER): Payer: 59 | Admitting: Podiatry

## 2023-01-28 ENCOUNTER — Encounter: Payer: Self-pay | Admitting: Podiatry

## 2023-01-28 DIAGNOSIS — B351 Tinea unguium: Secondary | ICD-10-CM | POA: Diagnosis not present

## 2023-01-28 DIAGNOSIS — M79674 Pain in right toe(s): Secondary | ICD-10-CM

## 2023-01-28 DIAGNOSIS — N183 Chronic kidney disease, stage 3 unspecified: Secondary | ICD-10-CM | POA: Diagnosis not present

## 2023-01-28 DIAGNOSIS — M79675 Pain in left toe(s): Secondary | ICD-10-CM

## 2023-01-28 DIAGNOSIS — E1122 Type 2 diabetes mellitus with diabetic chronic kidney disease: Secondary | ICD-10-CM | POA: Diagnosis not present

## 2023-01-31 ENCOUNTER — Encounter: Payer: Self-pay | Admitting: Podiatry

## 2023-01-31 NOTE — Progress Notes (Signed)
  Subjective:  Patient ID: Devon Price, male    DOB: 26-May-1960,  MRN: [id number]  62 y.o. male presents preventative diabetic foot care and painful elongated mycotic toenails 1-5 bilaterally which are tender when wearing enclosed shoe gear. Pain is relieved with periodic professional debridement.  Chief Complaint  Patient presents with   Diabetes    "Cut my nails."   New problem(s): None   PCP is Lauro Regulus, MD , and last visit was October 22, 2022.  No Known Allergies  Review of Systems: Negative except as noted in the HPI.   Objective:  ABHI QUINT is a pleasant 62 y.o. male obese in NAD.AAO x 3.  Vascular Examination: Vascular status intact b/l with palpable pedal pulses. CFT immediate b/l. Pedal hair present. No edema. No pain with calf compression b/l. Skin temperature gradient WNL b/l. No varicosities noted. No cyanosis or clubbing noted.  Neurological Examination: Sensation grossly intact b/l with 10 gram monofilament. Vibratory sensation intact b/l.  Dermatological Examination: Pedal skin with normal turgor, texture and tone b/l. No open wounds nor interdigital macerations noted. Toenails 1-5 b/l thick, discolored, elongated with subungual debris and pain on dorsal palpation. No hyperkeratotic lesions noted b/l.   Musculoskeletal Examination: Muscle strength 5/5 to b/l LE.  No pain, crepitus noted b/l. No gross pedal deformities. Patient ambulates independently without assistive aids.   Radiographs: None  Last A1c:       No data to display         Assessment:   1. Pain due to onychomycosis of toenails of both feet   2. Type 2 diabetes mellitus with stage 3 chronic kidney disease, without long-term current use of insulin, unspecified whether stage 3a or 3b CKD (HCC)    Plan:  -Patient was evaluated today. All questions/concerns addressed on today's visit. -Continue foot and shoe inspections daily. Monitor blood glucose per  PCP/Endocrinologist's recommendations. -Continue supportive shoe gear daily. -Toenails 1-5 b/l were debrided in length and girth with sterile nail nippers and dremel without iatrogenic bleeding.  -Patient/POA to call should there be question/concern in the interim.  Return in about 3 months (around 04/28/2023).  [name], DPM      [place] LOCATION: [address], [place] [address]                   Office [phone]   [facility] LOCATION: [address] [place], [place] [address] Office [phone]

## 2023-03-15 ENCOUNTER — Other Ambulatory Visit: Payer: Medicare Other

## 2023-04-29 ENCOUNTER — Encounter: Payer: Self-pay | Admitting: Podiatry

## 2023-04-29 ENCOUNTER — Ambulatory Visit (INDEPENDENT_AMBULATORY_CARE_PROVIDER_SITE_OTHER): Payer: 59 | Admitting: Podiatry

## 2023-04-29 VITALS — Ht 66.0 in | Wt 222.0 lb

## 2023-04-29 DIAGNOSIS — M79675 Pain in left toe(s): Secondary | ICD-10-CM | POA: Diagnosis not present

## 2023-04-29 DIAGNOSIS — E1122 Type 2 diabetes mellitus with diabetic chronic kidney disease: Secondary | ICD-10-CM | POA: Diagnosis not present

## 2023-04-29 DIAGNOSIS — M79674 Pain in right toe(s): Secondary | ICD-10-CM

## 2023-04-29 DIAGNOSIS — B351 Tinea unguium: Secondary | ICD-10-CM

## 2023-04-29 DIAGNOSIS — N183 Chronic kidney disease, stage 3 unspecified: Secondary | ICD-10-CM | POA: Diagnosis not present

## 2023-04-29 NOTE — Progress Notes (Signed)
  Subjective:  Patient ID: Devon Price, male    DOB: Sep 23, 1960,  MRN: [id number]  63 y.o. male presents preventative diabetic foot care and painful, elongated thickened toenails x 10 which are symptomatic when wearing enclosed shoe gear. This interferes with his/her daily activities. Chief Complaint  Patient presents with   Nail Problem    Pt is here for [facility] last A1C was 6.4 PCP is Dr Dareen Piano and LOV was in February.   New problem(s): None   PCP is Lauro Regulus, MD.  No Known Allergies  Review of Systems: Negative except as noted in the HPI.   Objective:  Devon Price is a pleasant 63 y.o. male obese in NAD. AAO x 3.  Vascular Examination: Vascular status intact b/l with palpable pedal pulses. CFT immediate b/l. Pedal hair present. No edema. No pain with calf compression b/l. Skin temperature gradient WNL b/l. No varicosities noted. No cyanosis or clubbing noted.  Neurological Examination: Sensation grossly intact b/l with 10 gram monofilament. Vibratory sensation intact b/l.  Dermatological Examination: Pedal skin with normal turgor, texture and tone b/l. No open wounds nor interdigital macerations noted. Toenails 1-5 b/l thick, discolored, elongated with subungual debris and pain on dorsal palpation. No hyperkeratotic lesions noted b/l.   Incurvated nailplate right great toe medial border(s) with tenderness to palpation. No erythema, no edema, no drainage noted.   Musculoskeletal Examination: Muscle strength 5/5 to b/l LE.  No pain, crepitus noted b/l. No gross pedal deformities. Patient ambulates independently without assistive aids.   Radiographs: None  Last A1c:       No data to display          Assessment:   1. Pain due to onychomycosis of toenails of both feet   2. Type 2 diabetes mellitus with stage 3 chronic kidney disease, without long-term current use of insulin, unspecified whether stage 3a or 3b CKD (HCC)    Plan:  Patient  was evaluated and treated. All patient's and/or POA's questions/concerns addressed on today's visit. Mycotic toenails 1-5 debrided in length and girth without incident.  Continue daily foot inspections and monitor blood glucose per PCP/Endocrinologist's recommendations.Continue soft, supportive shoe gear daily. Report any pedal injuries to medical professional. Call office if there are any quesitons/concerns. -Patient/POA to call should there be question/concern in the interim.  Return in about 3 months (around 07/30/2023).  [name], DPM      [place] LOCATION: [address] [place] [address]                   Office [phone]   [facility] LOCATION: [address] [place], [place] [address] Office [phone]

## 2023-07-30 ENCOUNTER — Encounter: Payer: Self-pay | Admitting: Podiatry

## 2023-07-30 ENCOUNTER — Ambulatory Visit (INDEPENDENT_AMBULATORY_CARE_PROVIDER_SITE_OTHER): Admitting: Podiatry

## 2023-07-30 VITALS — Ht 66.0 in | Wt 222.0 lb

## 2023-07-30 DIAGNOSIS — B351 Tinea unguium: Secondary | ICD-10-CM

## 2023-07-30 DIAGNOSIS — E1122 Type 2 diabetes mellitus with diabetic chronic kidney disease: Secondary | ICD-10-CM | POA: Diagnosis not present

## 2023-07-30 DIAGNOSIS — N183 Chronic kidney disease, stage 3 unspecified: Secondary | ICD-10-CM | POA: Diagnosis not present

## 2023-07-30 DIAGNOSIS — M79674 Pain in right toe(s): Secondary | ICD-10-CM | POA: Diagnosis not present

## 2023-07-30 DIAGNOSIS — M79675 Pain in left toe(s): Secondary | ICD-10-CM | POA: Diagnosis not present

## 2023-08-07 ENCOUNTER — Encounter: Payer: Self-pay | Admitting: Podiatry

## 2023-08-07 NOTE — Progress Notes (Signed)
 ANNUAL DIABETIC FOOT EXAM  Subjective: Devon Price presents today for annual diabetic foot exam.  Chief Complaint  Patient presents with   Nail Problem    Pt is here for [facility] last A1C was 6.3 PCP is Dr Alva Jewels and LOV was in March.   Patient confirms h/o diabetes.  Patient denies any h/o foot wounds.  Devon Moulding, MD is patient's PCP.  Past Medical History:  Diagnosis Date   Diabetes mellitus without complication (HCC)    Renal disorder    Patient Active Problem List   Diagnosis Date Noted   Pain due to onychomycosis of toenails of both feet 03/10/2021   Plantar fasciitis, bilateral 03/10/2021   Essential hypertension 04/16/2020   Palpitations 04/10/2019   Chronic migraine without aura 03/11/2017   Allergic rhinitis 07/06/2016   Health care maintenance 07/06/2016   Hypercholesterolemia 07/06/2016   Type 2 diabetes mellitus with stage 3 chronic kidney disease, without long-term current use of insulin (HCC) 07/06/2016   Acute idiopathic pericarditis 09/29/2015   BPH with obstruction/lower urinary tract symptoms 03/29/2013   Chronic kidney disease, stage II (mild) 03/29/2013   Incomplete emptying of bladder 03/29/2013   Kidney stone 03/29/2013   Unspecified renal colic 03/29/2013   Cervical spinal stenosis 09/21/2012   Facet hypertrophy of lumbar region 09/21/2012   Obesity, morbid (HCC) 09/21/2012   Work related injury 09/21/2012   History reviewed. No pertinent surgical history. Current Outpatient Medications on File Prior to Visit  Medication Sig Dispense Refill   atorvastatin (LIPITOR) 40 MG tablet Take 40 mg by mouth daily.     azelastine (ASTELIN) 0.1 % nasal spray USE 2 SPRAYS INTO EACH NOSTRIL TWICE A DAY     fluticasone-salmeterol (WIXELA INHUB) 100-50 MCG/ACT AEPB INHALE 1 INHALATION INTO THE LUNGS EVERY 12 HOURS     furosemide (LASIX) 20 MG tablet Take 20 mg by mouth daily as needed.     metFORMIN (GLUCOPHAGE) 1000 MG tablet Take 1,000  mg by mouth 2 (two) times daily with a meal.     MOUNJARO 10 MG/0.5ML Pen Inject 10 mg into the skin once a week.     omeprazole (PRILOSEC) 20 MG capsule SMARTSIG:1 Capsule(s) By Mouth Every Evening     oxyCODONE-acetaminophen (PERCOCET) 7.5-325 MG tablet Take 1 tablet by mouth 4 (four) times daily as needed.     tiZANidine (ZANAFLEX) 4 MG tablet Take by mouth.     triamcinolone  cream (KENALOG ) 0.1 % Apply 1 application topically 2 (two) times daily. 30 g 0   No current facility-administered medications on file prior to visit.    No Known Allergies Social History   Occupational History   Not on file  Tobacco Use   Smoking status: Never   Smokeless tobacco: Never  Vaping Use   Vaping status: Never Used  Substance and Sexual Activity   Alcohol use: No   Drug use: No   Sexual activity: Not on file   History reviewed. No pertinent family history. Immunization History  Administered Date(s) Administered   PFIZER Comirnaty(Gray Top)Covid-19 Tri-Sucrose Vaccine 01/01/2020   PFIZER(Purple Top)SARS-COV-2 Vaccination 05/25/2019, 06/20/2019     Review of Systems: Negative except as noted in the HPI.   Objective: There were no vitals filed for this visit.  YUUKI SKEENS is a pleasant 63 y.o. male in NAD. AAO X 3.  Diabetic foot exam was performed with the following findings:   Vascular Examination: Capillary refill time immediate b/l. Palpable pedal pulses. Pedal hair present b/l.  No pain with calf compression b/l. Skin temperature gradient WNL b/l. No cyanosis or clubbing b/l. No ischemia or gangrene noted b/l. No edema noted b/l LE.  Neurological Examination: Sensation grossly intact b/l with 10 gram monofilament. Vibratory sensation intact b/l.   Dermatological Examination: Pedal skin with normal turgor, texture and tone b/l.  No open wounds. No interdigital macerations.   Toenails 1-5 b/l thick, discolored, elongated with subungual debris and pain on dorsal palpation.    No corns, calluses nor porokeratotic lesions noted.  Musculoskeletal Examination: Muscle strength 5/5 to all lower extremity muscle groups bilaterally. No pain, crepitus or joint limitation noted with ROM b/l LE. Pes planovalgus deformity b/l LE. Patient ambulates independently without assistive aids.  Radiographs: None   ADA Risk Categorization: Low Risk :  Patient has all of the following: Intact protective sensation No prior foot ulcer  No severe deformity Pedal pulses present  Assessment: 1. Pain due to onychomycosis of toenails of both feet   2. Type 2 diabetes mellitus with stage 3 chronic kidney disease, without long-term current use of insulin, unspecified whether stage 3a or 3b CKD (HCC)     Plan: Diabetic foot examination performed today. All patient's and/or POA's questions/concerns addressed on today's visit. Toenails 1-5 debrided in length and girth without incident. Continue foot and shoe inspections daily. Monitor blood glucose per PCP/Endocrinologist's recommendations. Continue soft, supportive shoe gear daily. Report any pedal injuries to medical professional. Call office if there are any questions/concerns. -Patient/POA to call should there be question/concern in the interim. Return in about 3 months (around 10/30/2023).  Luella Sager, DPM      [place] LOCATION: [address] [place] [address]                   Office [phone]   [facility] LOCATION: [address] [place], [place] [address] Office [phone]

## 2023-11-05 ENCOUNTER — Encounter: Payer: Self-pay | Admitting: Podiatry

## 2023-11-05 ENCOUNTER — Ambulatory Visit (INDEPENDENT_AMBULATORY_CARE_PROVIDER_SITE_OTHER): Admitting: Podiatry

## 2023-11-05 DIAGNOSIS — N183 Chronic kidney disease, stage 3 unspecified: Secondary | ICD-10-CM | POA: Diagnosis not present

## 2023-11-05 DIAGNOSIS — M79674 Pain in right toe(s): Secondary | ICD-10-CM | POA: Diagnosis not present

## 2023-11-05 DIAGNOSIS — E1122 Type 2 diabetes mellitus with diabetic chronic kidney disease: Secondary | ICD-10-CM

## 2023-11-05 DIAGNOSIS — M79675 Pain in left toe(s): Secondary | ICD-10-CM

## 2023-11-05 DIAGNOSIS — B351 Tinea unguium: Secondary | ICD-10-CM

## 2023-11-09 ENCOUNTER — Encounter: Payer: Self-pay | Admitting: Podiatry

## 2023-11-09 NOTE — Progress Notes (Signed)
  Subjective:  Patient ID: Devon Price, male    DOB: [birth year],  MRN: [id number]  Devon Price presents to clinic today for at risk foot care. Pt has h/o NIDDM with chronic kidney disease and painful mycotic toenails of both feet that are difficult to trim. Pain interferes with daily activities and wearing enclosed shoe gear comfortably.  Chief Complaint  Patient presents with   [facility]    Rm3 Diabetic foot care/ blood sugar 110/ Dr. Layman Piety last visit    New problem(s): None.   PCP is Piety Layman ORN, MD. ARNETTA 09/24/2023.  No Known Allergies  Review of Systems: Negative except as noted in the HPI.  Objective: No changes noted in today's physical examination. There were no vitals filed for this visit. Devon Price is a pleasant 63 y.o. male in NAD. AAO x 3.  Neurovascular status intact bilaterally and symmetrically.   Dermatological Examination: Pedal skin with normal turgor, texture and tone b/l.  No open wounds. No interdigital macerations.   Toenails 1-5 b/l thick, discolored, elongated with subungual debris and pain on dorsal palpation.   No hyperkeratotic nor porokeratotic lesions present on today's visit.  Musculoskeletal Examination: Muscle strength 5/5 to all lower extremity muscle groups bilaterally. No pain, crepitus or joint limitation noted with ROM b/l LE. Pes planovalgus deformity b/l LE. Patient ambulates independently without assistive aids.  Assessment/Plan: 1. Pain due to onychomycosis of toenails of both feet   2. Type 2 diabetes mellitus with stage 3 chronic kidney disease, without long-term current use of insulin, unspecified whether stage 3a or 3b CKD (HCC)   Patient was evaluated and treated. All patient's and/or POA's questions/concerns addressed on today's visit. Mycotic toenails 1-5 b/l  debrided in length and girth without incident.  Continue daily foot inspections and monitor blood glucose per  PCP/Endocrinologist's recommendations.Continue soft, supportive shoe gear daily. Report any pedal injuries to medical professional. Call office if there are any quesitons/concerns.  Return in about 3 months (around 02/04/2024).  Devon Price, DPM      [place] LOCATION: [address], [place] [address]                   Office [phone]   [facility] LOCATION: [address] [place], [place] [address] Office [phone]

## 2024-02-04 ENCOUNTER — Ambulatory Visit (INDEPENDENT_AMBULATORY_CARE_PROVIDER_SITE_OTHER): Admitting: Podiatry

## 2024-02-04 DIAGNOSIS — E1122 Type 2 diabetes mellitus with diabetic chronic kidney disease: Secondary | ICD-10-CM

## 2024-02-04 DIAGNOSIS — M79674 Pain in right toe(s): Secondary | ICD-10-CM

## 2024-02-04 DIAGNOSIS — M79675 Pain in left toe(s): Secondary | ICD-10-CM | POA: Diagnosis not present

## 2024-02-04 DIAGNOSIS — B351 Tinea unguium: Secondary | ICD-10-CM | POA: Diagnosis not present

## 2024-02-04 DIAGNOSIS — L600 Ingrowing nail: Secondary | ICD-10-CM | POA: Diagnosis not present

## 2024-02-04 MED ORDER — DOXYCYCLINE HYCLATE 100 MG PO CAPS: 100.0000 mg | ORAL_CAPSULE | Freq: Two times a day (BID) | ORAL | 0 refills | Status: AC

## 2024-02-04 NOTE — Patient Instructions (Addendum)
 EPSOM SALT FOOT SOAK INSTRUCTIONS  *IF YOU HAVE BEEN PRESCRIBED ANTIBIOTICS, TAKE AS INSTRUCTED UNTIL ALL ARE GONE*  Shopping List:  A. Plain epsom salt (not scented) B. Neosporin Cream C. 1-inch fabric band-aids   Place 1/4 cup of epsom salts in 2 quarts of warm tap water. IF YOU ARE DIABETIC, OR HAVE NEUROPATHY, CHECK THE TEMPERATURE OF THE WATER WITH YOUR ELBOW.   Submerge your foot/feet in the solution and soak for 10-15 minutes.      3.  Next, remove your foot/feet from solution, blot dry the affected area.    4.  Apply light amount of antibiotic cream/ointment and cover with fabric band-aid .  5.  This soak should be done once a day for 7 days.   6.  Monitor for any signs/symptoms of infection such as redness, swelling, odor, drainage, increased pain, or non-healing of digit.   7.  Please do not hesitate to call the office and speak to a Nurse or Doctor if you have questions.   8.  If you experience fever, chills, nightsweats, nausea or vomiting with worsening of digit/foot, please go to the emergency room.   Ingrown Toenail  An ingrown toenail occurs when the corner or sides of a toenail grow into the surrounding skin. This causes discomfort and pain. The big toe is most commonly affected, but any of the toes can be affected. If an ingrown toenail is not treated, it can become infected. What are the causes? This condition may be caused by: Wearing shoes that are too small or tight. An injury, such as stubbing your toe or having your toe stepped on. Improper cutting or care of your toenails. Having nail or foot abnormalities that were present from birth (congenital abnormalities), such as having a nail that is too big for your toe. What increases the risk? The following factors may make you more likely to develop ingrown toenails: Age. Nails tend to get thicker with age, so ingrown nails are more common among older people. Cutting your toenails incorrectly, such as cutting  them very short or cutting them unevenly. An ingrown toenail is more likely to get infected if you have: Diabetes. Blood flow (circulation) problems. What are the signs or symptoms? Symptoms of an ingrown toenail may include: Pain, soreness, or tenderness. Redness. Swelling. Hardening of the skin that surrounds the toenail. Signs that an ingrown toenail may be infected include: Fluid or pus. Symptoms that get worse. How is this diagnosed? Ingrown toenails may be diagnosed based on: Your symptoms and medical history. A physical exam. Labs or tests. If you have fluid or blood coming from your toenail, a sample may be collected to test for the specific type of bacteria that is causing the infection. How is this treated? Treatment depends on the severity of your symptoms. You may be able to care for your toenail at home. If you have an infection, you may be prescribed antibiotic medicines. If you have fluid or pus draining from your toenail, your health care provider may drain it. If you have trouble walking, you may be given crutches to use. If you have a severe or infected ingrown toenail, you may need a procedure to remove part or all of the nail. Follow these instructions at home: Foot care  Check your wound every day for signs of infection, or as often as told by your health care provider. Check for: More redness, swelling, or pain. More fluid or blood. Warmth. Pus or a bad smell. Do  not pick at your toenail or try to remove it yourself. Soak your foot in warm, soapy water. Do this for 20 minutes, 3 times a day, or as often as told by your health care provider. This helps to keep your toe clean and your skin soft. Wear shoes that fit well and are not too tight. Your health care provider may recommend that you wear open-toed shoes while you heal. Trim your toenails regularly and carefully. Cut your toenails straight across to prevent injury to the skin at the corners of the  toenail. Do not cut your nails in a curved shape. Keep your feet clean and dry to help prevent infection. General instructions Take over-the-counter and prescription medicines only as told by your health care provider. If you were prescribed an antibiotic, take it as told by your health care provider. Do not stop taking the antibiotic even if you start to feel better. If your health care provider told you to use crutches to help you move around, use them as instructed. Return to your normal activities as told by your health care provider. Ask your health care provider what activities are safe for you. Keep all follow-up visits. This is important. Contact a health care provider if: You have more redness, swelling, pain, or other symptoms that do not improve with treatment. You have fluid, blood, or pus coming from your toenail. You have a red streak on your skin that starts at your foot and spreads up your leg. You have a fever. Summary An ingrown toenail occurs when the corner or sides of a toenail grow into the surrounding skin. This causes discomfort and pain. The big toe is most commonly affected, but any of the toes can be affected. If an ingrown toenail is not treated, it can become infected. Fluid or pus draining from your toenail is a sign of infection. Your health care provider may need to drain it. You may be given antibiotics to treat the infection. Trimming your toenails regularly and properly can help you prevent an ingrown toenail. This information is not intended to replace advice given to you by your health care provider. Make sure you discuss any questions you have with your health care provider. Document Revised: 06/11/2020 Document Reviewed: 06/11/2020 Elsevier Patient Education  2024 [employer].

## 2024-02-10 ENCOUNTER — Ambulatory Visit: Admitting: Podiatry

## 2024-02-10 DIAGNOSIS — L600 Ingrowing nail: Secondary | ICD-10-CM

## 2024-02-10 NOTE — Patient Instructions (Signed)

## 2024-02-10 NOTE — Progress Notes (Unsigned)
 Right hallux medial border ingrown

## 2024-02-12 ENCOUNTER — Encounter: Payer: Self-pay | Admitting: Podiatry

## 2024-02-12 NOTE — Progress Notes (Signed)
 "  Subjective:  Patient ID: Devon Price, male    DOB: [birth year],  MRN: [id number]  Devon Price presents to clinic today for preventative diabetic foot care for painful elongated mycotic toenails 1-5 bilaterally which are tender when wearing enclosed shoe gear. Pain is relieved with periodic professional debridement. Patient relates discomfort of right great toe lateral border. Chief Complaint  Patient presents with   Diabetes    A1c 6.5 Devon Price is his PCP and his last visit was in Dec.   New problem(s): None.   PCP is Devon Layman ORN, Devon Price.  Allergies[1]  Review of Systems: Negative except as noted in the HPI.  Objective: No changes noted in today's physical examination. There were no vitals filed for this visit. Devon Price is a pleasant 63 y.o. male in NAD. AAO x 3.  Neurovascular status intact bilaterally and symmetrically.   Dermatological Examination: Pedal skin with normal turgor, texture and tone b/l.  No open wounds. No interdigital macerations.   Toenails left hallux and 2-5 b/l thick, discolored, elongated with subungual debris and pain on dorsal palpation.   Incurvated nailplate lateral border right hallux.  Nail border hypertrophy minimal. There is tenderness to palpation. Sign(s) of infection: erythema, edema, and pain on palpation.  No hyperkeratotic nor porokeratotic lesions present on today's visit.  Musculoskeletal Examination: Muscle strength 5/5 to all lower extremity muscle groups bilaterally. No pain, crepitus or joint limitation noted with ROM b/l LE. Pes planovalgus deformity b/l LE. Patient ambulates independently without assistive aids.  Assessment/Plan: 1. Pain due to onychomycosis of toenails of both feet   2. Ingrown right big toenail   3. Type 2 diabetes mellitus with stage 3 chronic kidney disease, without long-term current use of insulin, unspecified whether stage 3a or 3b CKD (HCC)     Meds ordered this  encounter  Medications   doxycycline  (VIBRAMYCIN ) 100 MG capsule    Sig: Take 1 capsule (100 mg total) by mouth 2 (two) times daily for 10 days.    Dispense:  20 capsule    Refill:  0  -Consent given for treatment as described below: -Examined patient. -Patient to continue soft, supportive shoe gear daily. -Toenails were debrided in length and girth 2-5 bilaterally and left great toe with sterile nail nippers and dremel without iatrogenic bleeding.  -No invasive procedure(s) performed. Offending nail border debrided and curretaged right great toe utilizing sterile nail nipper and currette. Border(s) cleansed with alcohol and triple antibiotic ointment applied. Dispensed written instructions for once daily epsom salt soaks for 7 days. Call office if there are any concerns. -Rx for Doxycyline 100 mg, #20, to be taken one capsule twice daily for 10 days. -Patient scheduled to see Dr. Tobie in one week for follow up of ingrown toenail right hallux. -Patient/POA to call should there be question/concern in the interim.   Return in about 3 months (around 05/04/2024).  Devon Price, DPM      [place] LOCATION: [address], [place] [address]                   Office [phone]   [place] LOCATION: [address] [place], [place] [address] Office [phone])  [phone]     [1] No Known Allergies  "

## 2024-05-05 ENCOUNTER — Ambulatory Visit: Admitting: Podiatry
